# Patient Record
Sex: Female | Born: 1975 | Race: Black or African American | Hispanic: No | Marital: Married | Smoking: Never smoker
Health system: Southern US, Community
[De-identification: ages and names within clinical notes are randomized; demographics above are authoritative.]

## PROBLEM LIST (undated history)

## (undated) DIAGNOSIS — J45909 Unspecified asthma, uncomplicated: Secondary | ICD-10-CM

## (undated) DIAGNOSIS — K259 Gastric ulcer, unspecified as acute or chronic, without hemorrhage or perforation: Secondary | ICD-10-CM

## (undated) HISTORY — PX: CHOLECYSTECTOMY: SHX55

---

## 2014-09-20 ENCOUNTER — Emergency Department (HOSPITAL_COMMUNITY)
Admission: EM | Admit: 2014-09-20 | Discharge: 2014-09-21 | Disposition: A | Discharge status: Home / Self Care | Payer: Medicaid Other | Attending: Emergency Medicine | Admitting: Emergency Medicine

## 2014-09-20 ENCOUNTER — Encounter (HOSPITAL_COMMUNITY): Payer: Self-pay | Admitting: *Deleted

## 2014-09-20 ENCOUNTER — Encounter (HOSPITAL_COMMUNITY): Payer: Self-pay | Admitting: Emergency Medicine

## 2014-09-20 ENCOUNTER — Emergency Department (INDEPENDENT_AMBULATORY_CARE_PROVIDER_SITE_OTHER)
Admission: EM | Admit: 2014-09-20 | Discharge: 2014-09-20 | Disposition: A | Discharge status: Nursing Home (Includes ICF) | Payer: Medicaid Other | Source: Home / Self Care | Attending: Family Medicine | Admitting: Family Medicine

## 2014-09-20 DIAGNOSIS — R112 Nausea with vomiting, unspecified: Secondary | ICD-10-CM | POA: Diagnosis present

## 2014-09-20 DIAGNOSIS — E871 Hypo-osmolality and hyponatremia: Secondary | ICD-10-CM | POA: Diagnosis not present

## 2014-09-20 DIAGNOSIS — J45909 Unspecified asthma, uncomplicated: Secondary | ICD-10-CM | POA: Diagnosis not present

## 2014-09-20 DIAGNOSIS — K279 Peptic ulcer, site unspecified, unspecified as acute or chronic, without hemorrhage or perforation: Secondary | ICD-10-CM

## 2014-09-20 DIAGNOSIS — K219 Gastro-esophageal reflux disease without esophagitis: Secondary | ICD-10-CM | POA: Diagnosis not present

## 2014-09-20 DIAGNOSIS — N179 Acute kidney failure, unspecified: Secondary | ICD-10-CM

## 2014-09-20 DIAGNOSIS — R7989 Other specified abnormal findings of blood chemistry: Secondary | ICD-10-CM | POA: Insufficient documentation

## 2014-09-20 DIAGNOSIS — E860 Dehydration: Secondary | ICD-10-CM

## 2014-09-20 DIAGNOSIS — Z9049 Acquired absence of other specified parts of digestive tract: Secondary | ICD-10-CM | POA: Insufficient documentation

## 2014-09-20 DIAGNOSIS — R945 Abnormal results of liver function studies: Secondary | ICD-10-CM

## 2014-09-20 HISTORY — DX: Gastric ulcer, unspecified as acute or chronic, without hemorrhage or perforation: K25.9

## 2014-09-20 HISTORY — DX: Unspecified asthma, uncomplicated: J45.909

## 2014-09-20 LAB — CBC
HCT: 36.3 % (ref 36.0–46.0)
Hemoglobin: 12.6 g/dL (ref 12.0–15.0)
MCH: 27.6 pg (ref 26.0–34.0)
MCHC: 34.7 g/dL (ref 30.0–36.0)
MCV: 79.6 fL (ref 78.0–100.0)
Platelets: 229 10*3/uL (ref 150–400)
RBC: 4.56 MIL/uL (ref 3.87–5.11)
RDW: 13.3 % (ref 11.5–15.5)
WBC: 2.3 10*3/uL — ABNORMAL LOW (ref 4.0–10.5)

## 2014-09-20 LAB — URINALYSIS, ROUTINE W REFLEX MICROSCOPIC
BILIRUBIN URINE: NEGATIVE
GLUCOSE, UA: NEGATIVE mg/dL
HGB URINE DIPSTICK: NEGATIVE
KETONES UR: NEGATIVE mg/dL
Leukocytes, UA: NEGATIVE
NITRITE: NEGATIVE
Protein, ur: NEGATIVE mg/dL
SPECIFIC GRAVITY, URINE: 1.019 (ref 1.005–1.030)
UROBILINOGEN UA: 0.2 mg/dL (ref 0.0–1.0)
pH: 5.5 (ref 5.0–8.0)

## 2014-09-20 LAB — POCT I-STAT, CHEM 8
BUN: 13 mg/dL (ref 6–20)
CREATININE: 1.2 mg/dL — AB (ref 0.44–1.00)
Calcium, Ion: 1.23 mmol/L (ref 1.12–1.23)
Chloride: 94 mmol/L — ABNORMAL LOW (ref 101–111)
GLUCOSE: 72 mg/dL (ref 65–99)
HCT: 38 % (ref 36.0–46.0)
HEMOGLOBIN: 12.9 g/dL (ref 12.0–15.0)
Potassium: 4.1 mmol/L (ref 3.5–5.1)
Sodium: 129 mmol/L — ABNORMAL LOW (ref 135–145)
TCO2: 24 mmol/L (ref 0–100)

## 2014-09-20 LAB — POCT URINALYSIS DIP (DEVICE)
BILIRUBIN URINE: NEGATIVE
Glucose, UA: NEGATIVE mg/dL
Hgb urine dipstick: NEGATIVE
Ketones, ur: NEGATIVE mg/dL
NITRITE: NEGATIVE
PH: 5.5 (ref 5.0–8.0)
PROTEIN: NEGATIVE mg/dL
Specific Gravity, Urine: >=1.03 (ref 1.005–1.030)
Urobilinogen, UA: 0.2 mg/dL (ref 0.0–1.0)

## 2014-09-20 LAB — I-STAT BETA HCG BLOOD, ED (MC, WL, AP ONLY)

## 2014-09-20 LAB — POCT H PYLORI SCREEN: H. PYLORI SCREEN, POC: NEGATIVE

## 2014-09-20 LAB — COMPREHENSIVE METABOLIC PANEL
ALBUMIN: 4.8 g/dL (ref 3.5–5.0)
ALK PHOS: 85 U/L (ref 38–126)
ALT: 154 U/L — AB (ref 14–54)
ANION GAP: 10 (ref 5–15)
AST: 319 U/L — AB (ref 15–41)
BILIRUBIN TOTAL: 0.6 mg/dL (ref 0.3–1.2)
BUN: 10 mg/dL (ref 6–20)
CHLORIDE: 92 mmol/L — AB (ref 101–111)
CO2: 23 mmol/L (ref 22–32)
Calcium: 9.3 mg/dL (ref 8.9–10.3)
Creatinine, Ser: 1.08 mg/dL — ABNORMAL HIGH (ref 0.44–1.00)
GFR calc Af Amer: >60 mL/min (ref >60)
GFR calc non Af Amer: >60 mL/min (ref >60)
GLUCOSE: 66 mg/dL (ref 65–99)
POTASSIUM: 3.6 mmol/L (ref 3.5–5.1)
Sodium: 125 mmol/L — ABNORMAL LOW (ref 135–145)
Total Protein: 8.7 g/dL — ABNORMAL HIGH (ref 6.5–8.1)

## 2014-09-20 LAB — POCT PREGNANCY, URINE: Preg Test, Ur: NEGATIVE

## 2014-09-20 LAB — LIPASE, BLOOD: Lipase: 29 U/L (ref 22–51)

## 2014-09-20 MED ORDER — SODIUM CHLORIDE 0.9 % IV BOLUS (SEPSIS)
1000.0000 mL | Freq: Once | INTRAVENOUS | Status: AC
  Administered 2014-09-20: 1000 mL via INTRAVENOUS

## 2014-09-20 MED ORDER — ONDANSETRON 4 MG PO TBDP
ORAL_TABLET | ORAL | Status: AC
  Filled 2014-09-20: qty 1

## 2014-09-20 MED ORDER — GI COCKTAIL ~~LOC~~
30.0000 mL | Freq: Once | ORAL | Status: AC
  Administered 2014-09-20: 30 mL via ORAL

## 2014-09-20 MED ORDER — GI COCKTAIL ~~LOC~~
ORAL | Status: AC
  Filled 2014-09-20: qty 30

## 2014-09-20 MED ORDER — ONDANSETRON HCL 4 MG/2ML IJ SOLN
4.0000 mg | Freq: Once | INTRAMUSCULAR | Status: AC
  Administered 2014-09-20: 4 mg via INTRAVENOUS
  Filled 2014-09-20: qty 2

## 2014-09-20 MED ORDER — ONDANSETRON 4 MG PO TBDP
4.0000 mg | ORAL_TABLET | Freq: Once | ORAL | Status: AC
  Administered 2014-09-20: 4 mg via ORAL

## 2014-09-20 NOTE — Discharge Instructions (Signed)
You are very dehydrated Please go to the emergency room

## 2014-09-20 NOTE — ED Notes (Signed)
Pt being transferred to the ED via shuttle for further workup and fluids.  Report was called to the First RN, Clydie BraunKaren.

## 2014-09-20 NOTE — ED Provider Notes (Addendum)
CSN: [id number]     Arrival date & time 09/20/14  1328 History   First MD Initiated Contact with Patient 09/20/14 1402     Chief Complaint  Patient presents with  . Emesis  . Weakness  . Pain   (Consider location/radiation/quality/duration/timing/severity/associated sxs/prior Treatment) HPI  Pt encounter aided by husband who acts as interpreter. Patient with long-standing abdominal pain complaints with workup in [place] significant for gastritis/PUD. Cholecystectomy in 2012. Upper endoscopy in 2013. Patient states that she was on a medicine to help with this and has not taken it since [place] from [place] 2 [place] ago. Patient states that her abdominal pain started 2 days ago and she has not had anything to eat since that point time. Minimal quit intake during this period time. Patient reports normal daily bowel movements. Emesis 1 this morning which is nonbloody nonbilious. Patient states that she has episodes like this from time to time. Denies fevers, chest pain, short of breath, palpitations, neck stiffness, headache, rash, vaginal discharge, dysuria, frequency, back pain.   Past Medical History  Diagnosis Date  . Asthma   . Multiple gastric ulcers    Past Surgical History  Procedure Laterality Date  . Cholecystectomy     History reviewed. No pertinent family history. History  Substance Use Topics  . Smoking status: Never Smoker   . Smokeless tobacco: Never Used  . Alcohol Use: No   OB History    No data available     Review of Systems Per HPI with all other pertinent systems negative.   Allergies  Other  Home Medications   Prior to Admission medications   Not on File   BP 112/76 mmHg  Pulse 79  Temp(Src) 98.3 F (36.8 C) (Oral)  Resp 16  SpO2 100%  LMP 07/21/2014 (Approximate) Physical Exam  ED Course  Procedures (including critical care time) Labs Review Labs Reviewed  POCT URINALYSIS DIP (DEVICE) - Abnormal; Notable for the following:    Leukocytes, UA TRACE (*)    All other components within normal limits  POCT I-STAT, CHEM 8 - Abnormal; Notable for the following:    Sodium 129 (*)    Chloride 94 (*)    Creatinine, Ser 1.20 (*)    All other components within normal limits  POCT PREGNANCY, URINE  POCT H PYLORI SCREEN    Imaging Review No results found.   MDM   1. Dehydration   2. PUD (peptic ulcer disease)   3. AKI (acute kidney injury)   4. Hyponatremia    Pt pathetic looking and weak. Suspect her lab abnormalities are from dehydration. Pt arrived from [place] 2 wks ago and does not have a PCP. Pt had to have IVF for hyponatremia in [place] per husband. H.pylori neg.  Pt given gatorade (unable to obtain IV access), Zofran, and a GI cocktail.  WIll transfer pt to ED for further evaluation and treatment as appropriate.     Ozella Rocksavid J Merrell, MD 09/20/14 1558  Ozella Rocksavid J Merrell, MD 09/20/14 [phone]

## 2014-09-20 NOTE — ED Notes (Signed)
Pt is recently here from [place].  She has a report from her doctor in [place] about her issues.  Pt looks drawn, tired, and mournful.  Pt's husband states she has recently been vomiting.  She has pain in her mid chest, her head, her mid back and her lower extremities.  She has not been eating and she is unable to perform her ADL's or take care of her children.

## 2014-09-20 NOTE — ED Notes (Signed)
Family at bedside. 

## 2014-09-20 NOTE — ED Notes (Signed)
No answer x1

## 2014-09-20 NOTE — ED Notes (Signed)
Pt is a difficult stick, unable to obtain blood at triage.

## 2014-09-20 NOTE — ED Notes (Signed)
Pt sent here from [facility]. Reports having headache and vomiting this am. Reports pain to chest and back that occurs when vomiting. No acute distress noted at triage.

## 2014-09-21 ENCOUNTER — Emergency Department (HOSPITAL_COMMUNITY): Payer: Medicaid Other

## 2014-09-21 LAB — I-STAT TROPONIN, ED: Troponin i, poc: 0 ng/mL (ref 0.00–0.08)

## 2014-09-21 MED ORDER — SUCRALFATE 1 G PO TABS: 1.0000 g | ORAL_TABLET | Freq: Three times a day (TID) | ORAL | Status: DC

## 2014-09-21 MED ORDER — FAMOTIDINE 20 MG PO TABS: 20.0000 mg | ORAL_TABLET | Freq: Two times a day (BID) | ORAL | Status: DC

## 2014-09-21 MED ORDER — PROMETHAZINE HCL 25 MG PO TABS: 25.0000 mg | ORAL_TABLET | Freq: Three times a day (TID) | ORAL | Status: DC | PRN

## 2014-09-21 NOTE — Discharge Instructions (Signed)
Follow-up with the GI Dr. provided.  Return here as needed °

## 2014-09-21 NOTE — ED Provider Notes (Signed)
CSN: [id number]     Arrival date & time 09/20/14  1621 History   First MD Initiated Contact with Patient 09/20/14 1813     Chief Complaint  Patient presents with  . Emesis  . Headache  . Back Pain     (Consider location/radiation/quality/duration/timing/severity/associated sxs/prior Treatment) HPI Patient presents to the emergency department with upper abdominal pain with nausea and vomiting times one today.  Patient states that she has had episodes similar to this in the past.  She is also had a history of GERD and peptic ulcer disease.  Patient has no chest pain, shortness of breath, diarrhea, weakness, dizziness, headache, blurred vision, dysuria, incontinence, back pain, bloody stool, hematemesis, or syncope.  Patient states nothing seems make her condition better or worse.  Patient is not having any current pain at this time.  The husband is the translator for me.  He does seem to understand very well my questions Past Medical History  Diagnosis Date  . Asthma   . Multiple gastric ulcers    Past Surgical History  Procedure Laterality Date  . Cholecystectomy     Family History  Problem Relation Age of Onset  . Cancer Neg Hx   . Diabetes Neg Hx   . Heart failure Neg Hx   . Hyperlipidemia Neg Hx    History  Substance Use Topics  . Smoking status: Never Smoker   . Smokeless tobacco: Never Used  . Alcohol Use: No   OB History    No data available     Review of Systems   All other systems negative except as documented in the HPI. All pertinent positives and negatives as reviewed in the HPI. Allergies  Other  Home Medications   Prior to Admission medications   Not on File   BP 93/74 mmHg  Pulse 50  Temp(Src) 98.3 F (36.8 C) (Oral)  Resp 5  SpO2 99%  LMP 07/21/2014 (Approximate) Physical Exam  Constitutional: She is oriented to person, place, and time. She appears well-developed and well-nourished.  HENT:  Head: Normocephalic and atraumatic.  Mouth/Throat:  Oropharynx is clear and moist.  Eyes: Pupils are equal, round, and reactive to light.  Neck: Normal range of motion. Neck supple.  Cardiovascular: Normal rate and normal heart sounds.  Exam reveals no gallop and no friction rub.   No murmur heard. Pulmonary/Chest: Effort normal and breath sounds normal. No respiratory distress.  Abdominal: Soft. Bowel sounds are normal. She exhibits no distension. There is no tenderness. There is no rebound and no guarding.  Musculoskeletal: She exhibits no edema.  Neurological: She is alert and oriented to person, place, and time. She exhibits normal muscle tone. Coordination normal.  Skin: Skin is warm and dry. No rash noted. No erythema.  Psychiatric: She has a normal mood and affect. Her behavior is normal.  Nursing note and vitals reviewed.   ED Course  Procedures (including critical care time) Labs Review Labs Reviewed  COMPREHENSIVE METABOLIC PANEL - Abnormal; Notable for the following:    Sodium 125 (*)    Chloride 92 (*)    Creatinine, Ser 1.08 (*)    Total Protein 8.7 (*)    AST 319 (*)    ALT 154 (*)    All other components within normal limits  CBC - Abnormal; Notable for the following:    WBC 2.3 (*)    All other components within normal limits  LIPASE, BLOOD  URINALYSIS, ROUTINE W REFLEX MICROSCOPIC (NOT AT [facility])  RAPID  HIV SCREEN (HIV 1/2 AB+AG)  I-STAT BETA HCG BLOOD, ED ([facility], [facility], [facility] ONLY)  I-STAT TROPOININ, ED    Imaging Review No results found.   EKG Interpretation None      MDM   Final diagnoses:  Elevated LFTs    The patient is feeling better at this time following IV fluids.  There was difficulty getting her IV and that is caused her to have an extended emergency department visit.  Patient is going to need follow-up with GI and further evaluation  Charlestine NightChristopher Trajon Rosete, PA-C 09/21/14 0103  Gilda Creasehristopher J Pollina, MD 09/21/14 0111

## 2014-09-22 ENCOUNTER — Encounter (HOSPITAL_COMMUNITY): Payer: Self-pay | Admitting: Emergency Medicine

## 2014-09-22 ENCOUNTER — Emergency Department (HOSPITAL_COMMUNITY)
Admission: EM | Admit: 2014-09-22 | Discharge: 2014-09-22 | Disposition: A | Discharge status: Home / Self Care | Payer: Medicaid Other | Attending: Emergency Medicine | Admitting: Emergency Medicine

## 2014-09-22 DIAGNOSIS — R112 Nausea with vomiting, unspecified: Secondary | ICD-10-CM | POA: Diagnosis present

## 2014-09-22 DIAGNOSIS — R531 Weakness: Secondary | ICD-10-CM | POA: Diagnosis not present

## 2014-09-22 DIAGNOSIS — R1013 Epigastric pain: Secondary | ICD-10-CM | POA: Diagnosis not present

## 2014-09-22 DIAGNOSIS — J45909 Unspecified asthma, uncomplicated: Secondary | ICD-10-CM | POA: Insufficient documentation

## 2014-09-22 DIAGNOSIS — Z79899 Other long term (current) drug therapy: Secondary | ICD-10-CM | POA: Insufficient documentation

## 2014-09-22 LAB — COMPREHENSIVE METABOLIC PANEL
ALBUMIN: 4.4 g/dL (ref 3.5–5.0)
ALT: 135 U/L — AB (ref 14–54)
AST: 238 U/L — ABNORMAL HIGH (ref 15–41)
Alkaline Phosphatase: 86 U/L (ref 38–126)
Anion gap: 9 (ref 5–15)
BUN: 9 mg/dL (ref 6–20)
CHLORIDE: 90 mmol/L — AB (ref 101–111)
CO2: 22 mmol/L (ref 22–32)
CREATININE: 0.93 mg/dL (ref 0.44–1.00)
Calcium: 8.5 mg/dL — ABNORMAL LOW (ref 8.9–10.3)
Glucose, Bld: 58 mg/dL — ABNORMAL LOW (ref 65–99)
Potassium: 3.5 mmol/L (ref 3.5–5.1)
SODIUM: 121 mmol/L — AB (ref 135–145)
TOTAL PROTEIN: 7.8 g/dL (ref 6.5–8.1)
Total Bilirubin: 0.7 mg/dL (ref 0.3–1.2)

## 2014-09-22 LAB — CBC WITH DIFFERENTIAL/PLATELET
BASOS PCT: 3 % — AB (ref 0–1)
Basophils Absolute: 0.1 10*3/uL (ref 0.0–0.1)
Eosinophils Absolute: 0 10*3/uL (ref 0.0–0.7)
Eosinophils Relative: 2 % (ref 0–5)
HCT: 32.9 % — ABNORMAL LOW (ref 36.0–46.0)
Hemoglobin: 11.9 g/dL — ABNORMAL LOW (ref 12.0–15.0)
LYMPHS ABS: 0.9 10*3/uL (ref 0.7–4.0)
Lymphocytes Relative: 40 % (ref 12–46)
MCH: 28.1 pg (ref 26.0–34.0)
MCHC: 36.2 g/dL — ABNORMAL HIGH (ref 30.0–36.0)
MCV: 77.8 fL — AB (ref 78.0–100.0)
MONOS PCT: 11 % (ref 3–12)
Monocytes Absolute: 0.3 10*3/uL (ref 0.1–1.0)
NEUTROS ABS: 1 10*3/uL — AB (ref 1.7–7.7)
Neutrophils Relative %: 44 % (ref 43–77)
PLATELETS: 230 10*3/uL (ref 150–400)
RBC: 4.23 MIL/uL (ref 3.87–5.11)
RDW: 13 % (ref 11.5–15.5)
WBC: 2.3 10*3/uL — ABNORMAL LOW (ref 4.0–10.5)

## 2014-09-22 MED ORDER — ONDANSETRON HCL 4 MG PO TABS: 4.0000 mg | ORAL_TABLET | Freq: Four times a day (QID) | ORAL | Status: DC

## 2014-09-22 MED ORDER — GI COCKTAIL ~~LOC~~
30.0000 mL | Freq: Once | ORAL | Status: AC
  Administered 2014-09-22: 30 mL via ORAL
  Filled 2014-09-22: qty 30

## 2014-09-22 MED ORDER — SODIUM CHLORIDE 0.9 % IV BOLUS (SEPSIS)
1000.0000 mL | Freq: Once | INTRAVENOUS | Status: AC
  Administered 2014-09-22: 1000 mL via INTRAVENOUS

## 2014-09-22 NOTE — ED Notes (Signed)
Bed: WA25 Expected date:  Expected time:  Means of arrival:  Comments: Pt still in room  

## 2014-09-22 NOTE — ED Notes (Signed)
DC instructions completed. Pt would not acknowledge questions. DC instruction reviewed with pt and spouse. Spouse reports that pt is unable to walk, he made reference to prior admission to [facility] earlier this week and taxi voucher given. Will f/u with SW

## 2014-09-22 NOTE — ED Notes (Signed)
Nurse currently starting IV 

## 2014-09-22 NOTE — Progress Notes (Signed)
EDCM spoke to patient's husband at bedside.  Patient is asleep.  Patient's husband remarks they have only been in the [place] for 2 [place].  Patient listed as having Medicaid [place].  EDCM provided patient's husband with list of pcps who [place].  Explained to patient's husband he may pick one pcp and call the phone number listed to establish primary care for his wife.  Patient's husband verbalized understanding.  No further EDCM needs at this time.

## 2014-09-22 NOTE — ED Notes (Signed)
Questions r/t dc were denied. Pt will use wheelchair for transport home. Spouse at bedside

## 2014-09-22 NOTE — Progress Notes (Signed)
CSW was notified by Nurse that the pt is having issues with transportation.   CSW provided pt with [employer] vouchers. Husband rode with pt also.  Trish MageBrittney Detric Scalisi, LCSWA [phone] ED CSW 09/22/2014 8:32 PM

## 2014-09-22 NOTE — Discharge Instructions (Signed)
Nausea and Vomiting Follow up with your primary care physician. Take Zofran as needed for nausea. Nausea is a sick feeling that often comes before throwing up (vomiting). Vomiting is a reflex where stomach contents come out of your mouth. Vomiting can cause severe loss of body fluids (dehydration). Children and elderly adults can become dehydrated quickly, especially if they also have diarrhea. Nausea and vomiting are symptoms of a condition or disease. It is important to find the cause of your symptoms. CAUSES   Direct irritation of the stomach lining. This irritation can result from increased acid production (gastroesophageal reflux disease), infection, food poisoning, taking certain medicines (such as nonsteroidal anti-inflammatory drugs), alcohol use, or tobacco use.  Signals from the brain.These signals could be caused by a headache, heat exposure, an inner ear disturbance, increased pressure in the brain from injury, infection, a tumor, or a concussion, pain, emotional stimulus, or metabolic problems.  An obstruction in the gastrointestinal tract (bowel obstruction).  Illnesses such as diabetes, hepatitis, gallbladder problems, appendicitis, kidney problems, cancer, sepsis, atypical symptoms of a heart attack, or eating disorders.  Medical treatments such as chemotherapy and radiation.  Receiving medicine that makes you sleep (general anesthetic) during surgery. DIAGNOSIS Your caregiver may ask for tests to be done if the problems do not improve after a few days. Tests may also be done if symptoms are severe or if the reason for the nausea and vomiting is not clear. Tests may include:  Urine tests.  Blood tests.  Stool tests.  Cultures (to look for evidence of infection).  X-rays or other imaging studies. Test results can help your caregiver make decisions about treatment or the need for additional tests. TREATMENT You need to stay well hydrated. Drink frequently but in small  amounts.You may wish to drink water, sports drinks, clear broth, or eat frozen ice pops or gelatin dessert to help stay hydrated.When you eat, eating slowly may help prevent nausea.There are also some antinausea medicines that may help prevent nausea. HOME CARE INSTRUCTIONS   Take all medicine as directed by your caregiver.  If you do not have an appetite, do not force yourself to eat. However, you must continue to drink fluids.  If you have an appetite, eat a normal diet unless your caregiver tells you differently.  Eat a variety of complex carbohydrates (rice, wheat, potatoes, bread), lean meats, yogurt, fruits, and vegetables.  Avoid high-fat foods because they are more difficult to digest.  Drink enough water and fluids to keep your urine clear or pale yellow.  If you are dehydrated, ask your caregiver for specific rehydration instructions. Signs of dehydration may include:  Severe thirst.  Dry lips and mouth.  Dizziness.  Dark urine.  Decreasing urine frequency and amount.  Confusion.  Rapid breathing or pulse. SEEK IMMEDIATE MEDICAL CARE IF:   You have blood or brown flecks (like coffee grounds) in your vomit.  You have black or bloody stools.  You have a severe headache or stiff neck.  You are confused.  You have severe abdominal pain.  You have chest pain or trouble breathing.  You do not urinate at least once every 8 hours.  You develop cold or clammy skin.  You continue to vomit for longer than 24 to 48 hours.  You have a fever. MAKE SURE YOU:   Understand these instructions.  Will watch your condition.  Will get help right away if you are not doing well or get worse. Document Released: 02/24/2005 Document Revised: 05/19/2011 Document  Reviewed: 07/24/2010 ExitCare Patient Information 2015 [place], [place]. This information is not intended to replace advice given to you by your health care provider. Make sure you discuss any questions you have  with your health care provider.

## 2014-09-22 NOTE — ED Notes (Signed)
Per family member, was seen at [facility] yesterday for same symptoms-unable to keep food, medicine down

## 2014-09-22 NOTE — ED Provider Notes (Signed)
CSN: [id number]     Arrival date & time 09/22/14  1427 History   First MD Initiated Contact with Patient 09/22/14 1620     Chief Complaint  Patient presents with  . Emesis     (Consider location/radiation/quality/duration/timing/severity/associated sxs/prior Treatment) Patient is a 39 y.o. female presenting with vomiting. The history is provided by the patient. No language interpreter was used.  Emesis Associated symptoms: no chills and no diarrhea   Jody Powell is a 38 y.o female with a history of asthma who presents with abdominal pain with nausea and vomiting for the past 3 days. Jody Powell states Jody Powell's had similar episodes to this in the past with a history of PUD and GERD. Jody Powell LMP was at the end of May. Jody Powell states Jody Powell was given Carafate, Pepcid, Phenergan by Jody Powell PCP on 09/20/2014. Jody Powell states Jody Powell vomited the Carafate but felt better with Pepcid and Phenergan and has not vomited today. Jody Powell denies any fever, chills, dizziness, chest pain, cough, shortness of breath, diarrhea, constipation, hematemesis, hematochezia. Jody Powell husband is the Nurse, learning disability.  Past Medical History  Diagnosis Date  . Asthma   . Multiple gastric ulcers    Past Surgical History  Procedure Laterality Date  . Cholecystectomy     Family History  Problem Relation Age of Onset  . Cancer Neg Hx   . Diabetes Neg Hx   . Heart failure Neg Hx   . Hyperlipidemia Neg Hx    History  Substance Use Topics  . Smoking status: Never Smoker   . Smokeless tobacco: Never Used  . Alcohol Use: No   OB History    No data available     Review of Systems  Constitutional: Negative for fever and chills.  Respiratory: Negative for shortness of breath.   Cardiovascular: Negative for chest pain.  Gastrointestinal: Positive for vomiting. Negative for diarrhea and constipation.  Genitourinary: Negative for hematuria, vaginal bleeding and vaginal discharge.  Neurological: Positive for weakness. Negative for dizziness, syncope and  light-headedness.  All other systems reviewed and are negative.     Allergies  Other  Home Medications   Prior to Admission medications   Medication Sig Start Date End Date Taking? Authorizing Provider  famotidine (PEPCID) 20 MG tablet Take 1 tablet (20 mg total) by mouth 2 (two) times daily. 09/21/14  Yes Christopher Lawyer, PA-C  promethazine (PHENERGAN) 25 MG tablet Take 1 tablet (25 mg total) by mouth every 8 (eight) hours as needed for nausea or vomiting. 09/21/14  Yes Charlestine Night, PA-C  sucralfate (CARAFATE) 1 G tablet Take 1 tablet (1 g total) by mouth 4 (four) times daily -  with meals and at bedtime. 09/21/14  Yes Christopher Lawyer, PA-C  ondansetron (ZOFRAN) 4 MG tablet Take 1 tablet (4 mg total) by mouth every 6 (six) hours. 09/22/14   Endrit Gittins Patel-Mills, PA-C   BP 108/75 mmHg  Pulse 65  Temp(Src) 98.3 F (36.8 C) (Oral)  Resp 16  SpO2 98%  LMP 07/21/2014 (Approximate) Physical Exam  Constitutional: Jody Powell is oriented to person, place, and time. Jody Powell appears well-developed and well-nourished. No distress.  HENT:  Head: Normocephalic and atraumatic.  Eyes: Conjunctivae are normal.  Neck: Normal range of motion. Neck supple.  Cardiovascular: Normal rate, regular rhythm and normal heart sounds.   Pulmonary/Chest: Effort normal and breath sounds normal. No respiratory distress. Jody Powell has no wheezes. Jody Powell has no rales.  Abdominal: Soft. Jody Powell exhibits no distension and no mass. There is tenderness in the epigastric area. There is no  rigidity, no rebound, no guarding and no CVA tenderness.  Musculoskeletal: Normal range of motion. Jody Powell exhibits no edema.  Neurological: Jody Powell is alert and oriented to person, place, and time.  Skin: Skin is warm and dry.  Psychiatric: Jody Powell has a normal mood and affect. Jody Powell behavior is normal.  Nursing note and vitals reviewed.   ED Course  Procedures (including critical care time) Labs Review Labs Reviewed  CBC WITH DIFFERENTIAL/PLATELET -  Abnormal; Notable for the following:    WBC 2.3 (*)    Hemoglobin 11.9 (*)    HCT 32.9 (*)    MCV 77.8 (*)    MCHC 36.2 (*)    Basophils Relative 3 (*)    Neutro Abs 1.0 (*)    All other components within normal limits  COMPREHENSIVE METABOLIC PANEL - Abnormal; Notable for the following:    Sodium 121 (*)    Chloride 90 (*)    Glucose, Bld 58 (*)    Calcium 8.5 (*)    AST 238 (*)    ALT 135 (*)    All other components within normal limits    Imaging Review No results found.   EKG Interpretation None      MDM   Final diagnoses:  Non-intractable vomiting with nausea, vomiting of unspecified type   Jody Powell was hypotensive on initial evaluation. Patient was seen on 09/20/2014 by Jody Powell primary care physician as well as in the ED for the same. At that time Jody Powell beta hCG was negative. Jody Powell labs were not concerning. Jody Powell had a cardiac workup which was negative also.  Due to Jody Powell previous workups I obtained a CMP and CBC only which were not concerning. Medications  sodium chloride 0.9 % bolus 1,000 mL (0 mLs Intravenous Stopped 09/22/14 1918)  gi cocktail (Maalox,Lidocaine,Donnatal) (30 mLs Oral Given 09/22/14 1704)  Recheck: Jody Powell is tolerating by mouth fluids and eating crackers at bedside. Jody Powell states Jody Powell no longer has abdominal pain after fluids and GI cocktail.  I gave Jody Powell zofran and discussed following up with Jody Powell primary care physician.     Catha GosselinHanna Patel-Mills, PA-C 09/23/14 1233  Doug SouSam Jacubowitz, MD 09/23/14 [phone]

## 2014-09-22 NOTE — ED Notes (Signed)
Pt alert and oriented x4. Respirations even and unlabored, bilateral symmetrical rise and fall of chest. Skin warm and dry. In no acute distress. Denies needs.   

## 2014-10-06 ENCOUNTER — Encounter (HOSPITAL_COMMUNITY): Payer: Self-pay | Admitting: Emergency Medicine

## 2014-10-06 ENCOUNTER — Emergency Department (HOSPITAL_COMMUNITY): Payer: Medicaid Other

## 2014-10-06 ENCOUNTER — Emergency Department (HOSPITAL_COMMUNITY)
Admission: EM | Admit: 2014-10-06 | Discharge: 2014-10-07 | Disposition: A | Discharge status: Home / Self Care | Payer: Medicaid Other | Attending: Emergency Medicine | Admitting: Emergency Medicine

## 2014-10-06 DIAGNOSIS — Z8719 Personal history of other diseases of the digestive system: Secondary | ICD-10-CM | POA: Insufficient documentation

## 2014-10-06 DIAGNOSIS — R079 Chest pain, unspecified: Secondary | ICD-10-CM | POA: Diagnosis present

## 2014-10-06 DIAGNOSIS — Z79899 Other long term (current) drug therapy: Secondary | ICD-10-CM | POA: Insufficient documentation

## 2014-10-06 DIAGNOSIS — J45909 Unspecified asthma, uncomplicated: Secondary | ICD-10-CM | POA: Diagnosis not present

## 2014-10-06 DIAGNOSIS — R1010 Upper abdominal pain, unspecified: Secondary | ICD-10-CM | POA: Diagnosis not present

## 2014-10-06 DIAGNOSIS — Z3202 Encounter for pregnancy test, result negative: Secondary | ICD-10-CM | POA: Diagnosis not present

## 2014-10-06 LAB — CBC
HCT: 32.8 % — ABNORMAL LOW (ref 36.0–46.0)
Hemoglobin: 11.4 g/dL — ABNORMAL LOW (ref 12.0–15.0)
MCH: 27.3 pg (ref 26.0–34.0)
MCHC: 34.8 g/dL (ref 30.0–36.0)
MCV: 78.5 fL (ref 78.0–100.0)
PLATELETS: 228 10*3/uL (ref 150–400)
RBC: 4.18 MIL/uL (ref 3.87–5.11)
RDW: 13.1 % (ref 11.5–15.5)
WBC: 3.1 10*3/uL — AB (ref 4.0–10.5)

## 2014-10-06 LAB — BASIC METABOLIC PANEL
ANION GAP: 9 (ref 5–15)
BUN: 11 mg/dL (ref 6–20)
CHLORIDE: 94 mmol/L — AB (ref 101–111)
CO2: 22 mmol/L (ref 22–32)
Calcium: 8.8 mg/dL — ABNORMAL LOW (ref 8.9–10.3)
Creatinine, Ser: 1.07 mg/dL — ABNORMAL HIGH (ref 0.44–1.00)
GFR calc Af Amer: >60 mL/min (ref >60)
GFR calc non Af Amer: >60 mL/min (ref >60)
Glucose, Bld: 72 mg/dL (ref 65–99)
Potassium: 3.6 mmol/L (ref 3.5–5.1)
Sodium: 125 mmol/L — ABNORMAL LOW (ref 135–145)

## 2014-10-06 LAB — POC URINE PREG, ED: Preg Test, Ur: NEGATIVE

## 2014-10-06 LAB — I-STAT TROPONIN, ED: Troponin i, poc: 0 ng/mL (ref 0.00–0.08)

## 2014-10-06 MED ORDER — ONDANSETRON HCL 4 MG/2ML IJ SOLN
4.0000 mg | Freq: Once | INTRAMUSCULAR | Status: AC
  Administered 2014-10-06: 4 mg via INTRAVENOUS
  Filled 2014-10-06: qty 2

## 2014-10-06 MED ORDER — SODIUM CHLORIDE 0.9 % IV BOLUS (SEPSIS)
1000.0000 mL | Freq: Once | INTRAVENOUS | Status: AC
Start: 2014-10-06 — End: 2014-10-07
  Administered 2014-10-06: 1000 mL via INTRAVENOUS

## 2014-10-06 MED ORDER — IOHEXOL 300 MG/ML  SOLN: 100.0000 mL | Freq: Once | INTRAMUSCULAR | Status: AC | PRN

## 2014-10-06 MED ORDER — IOHEXOL 300 MG/ML  SOLN
25.0000 mL | Freq: Once | INTRAMUSCULAR | Status: AC | PRN
  Administered 2014-10-06: 25 mL via ORAL

## 2014-10-06 NOTE — ED Notes (Signed)
PA at bedside.

## 2014-10-06 NOTE — ED Notes (Signed)
Patient transported to CT 

## 2014-10-06 NOTE — ED Notes (Signed)
Pt speaks [place]ili; Husband at bedside and speaks some [place]

## 2014-10-06 NOTE — ED Notes (Signed)
RN attempted to start IV; 2nd RN to start IV  

## 2014-10-06 NOTE — ED Notes (Signed)
Pt. reports intermittent central chest pain /right lateral chest pain onset this week with generalized body aches and emesis . Denies SOB , no cough or congestion .

## 2014-10-06 NOTE — ED Provider Notes (Signed)
CSN: [id number]     Arrival date & time 10/06/14  1911 History   First MD Initiated Contact with Patient 10/06/14 2030     Chief Complaint  Patient presents with  . Chest Pain     (Consider location/radiation/quality/duration/timing/severity/associated sxs/prior Treatment) HPI Comments: Patient is a 39 year old female with a past medical history of gastric ulcers and asthma who presents with epigastric abdominal pain and right flank pain that started about 2 weeks ago. Symptoms started gradually and have been intermittent since the onset. Patient is unable to characterize the pain. The pain is severe and does not radiate. She reports associated nausea. No aggravating/alleviating factors. No other associated symptoms. Patient has tried pepcid, phenergan, and zofran for symptoms without relief. Patient's husband is present who provides the history. He reports they arrived from [place] 1 month ago.    Past Medical History  Diagnosis Date  . Asthma   . Multiple gastric ulcers    Past Surgical History  Procedure Laterality Date  . Cholecystectomy     Family History  Problem Relation Age of Onset  . Cancer Neg Hx   . Diabetes Neg Hx   . Heart failure Neg Hx   . Hyperlipidemia Neg Hx    History  Substance Use Topics  . Smoking status: Never Smoker   . Smokeless tobacco: Never Used  . Alcohol Use: No   OB History    No data available     Review of Systems  Cardiovascular: Positive for chest pain.  Gastrointestinal: Positive for abdominal pain.  All other systems reviewed and are negative.     Allergies  Other  Home Medications   Prior to Admission medications   Medication Sig Start Date End Date Taking? Authorizing Provider  famotidine (PEPCID) 20 MG tablet Take 1 tablet (20 mg total) by mouth 2 (two) times daily. 09/21/14   Christopher Lawyer, PA-C  ondansetron (ZOFRAN) 4 MG tablet Take 1 tablet (4 mg total) by mouth every 6 (six) hours. 09/22/14   Hanna Patel-Mills,  PA-C  promethazine (PHENERGAN) 25 MG tablet Take 1 tablet (25 mg total) by mouth every 8 (eight) hours as needed for nausea or vomiting. 09/21/14   Charlestine Night, PA-C   BP 105/81 mmHg  Pulse 84  Temp(Src) 97.8 F (36.6 C) (Oral)  Resp 20  SpO2 99%  LMP 07/21/2014 (Approximate) Physical Exam  Constitutional: She is oriented to person, place, and time. She appears well-developed and well-nourished. No distress.  HENT:  Head: Normocephalic and atraumatic.  Eyes: Conjunctivae and EOM are normal.  Neck: Normal range of motion.  Cardiovascular: Normal rate and regular rhythm.  Exam reveals no gallop and no friction rub.   No murmur heard. Pulses equal of bilateral lower extremities.   Pulmonary/Chest: Effort normal and breath sounds normal. She has no wheezes. She has no rales. She exhibits no tenderness.  Abdominal: Soft. She exhibits no distension. There is tenderness. There is no rebound.  Central abdominal tenderness to palpation.   Musculoskeletal: Normal range of motion.  Neurological: She is alert and oriented to person, place, and time. Coordination normal.  Speech is goal-oriented. Moves limbs without ataxia.   Skin: Skin is warm and dry.  Psychiatric: She has a normal mood and affect. Her behavior is normal.  Nursing note and vitals reviewed.   ED Course  Procedures (including critical care time) Labs Review Labs Reviewed  BASIC METABOLIC PANEL - Abnormal; Notable for the following:    Sodium 125 (*)  Chloride 94 (*)    Creatinine, Ser 1.07 (*)    Calcium 8.8 (*)    All other components within normal limits  CBC - Abnormal; Notable for the following:    WBC 3.1 (*)    Hemoglobin 11.4 (*)    HCT 32.8 (*)    All other components within normal limits  HEPATIC FUNCTION PANEL  LIPASE, BLOOD  I-STAT TROPOININ, ED  POC URINE PREG, ED  I-STAT TROPOININ, ED    Imaging Review Dg Chest 2 View  10/06/2014   CLINICAL DATA:  Intermittent central and right lateral  chest pain, onset earlier this week. No dyspnea.  EXAM: CHEST  2 VIEW  COMPARISON:  None.  FINDINGS: The heart size and mediastinal contours are within normal limits. Both lungs are clear. The visualized skeletal structures are unremarkable.  IMPRESSION: No active cardiopulmonary disease.   Electronically Signed   By: Ellery Plunk M.D.   On: 10/06/2014 20:10   Ct Abdomen Pelvis W Contrast  10/07/2014   CLINICAL DATA:  Abdominal pain.  History of gastric ulcers.  EXAM: CT ABDOMEN AND PELVIS WITH CONTRAST  TECHNIQUE: Multidetector CT imaging of the abdomen and pelvis was performed using the standard protocol following bolus administration of intravenous contrast.  CONTRAST:  75mL OMNIPAQUE IOHEXOL 300 MG/ML  SOLN  COMPARISON:  Ultrasound 09/21/2014  FINDINGS: Lower chest: There are mild ground-glass opacities in the posterior lower lobes bilaterally, infectious versus atelectatic. There is a trace right pleural effusion.  Hepatobiliary: There is prior cholecystectomy. The liver and bile ducts appear unremarkable.  Pancreas: Normal  Spleen: Normal  Adrenals/Urinary Tract: The adrenals and kidneys are normal in appearance. There is no urinary calculus evident. There is no hydronephrosis or ureteral dilatation. Collecting systems and ureters appear unremarkable.  Stomach/Bowel: There is mild mural thickening of the entire colon, raising the question of colitis. No focal abnormality is evident. There is no bowel obstruction. There is no extraluminal air. Small bowel is unremarkable. Stomach is unremarkable.  Vascular/Lymphatic: The abdominal aorta is normal in caliber. There is no atherosclerotic calcification. There is no adenopathy in the abdomen or pelvis.  Reproductive: The uterus and ovaries appear unremarkable.  Other: No focal inflammatory changes are evident in the abdomen or pelvis. There is no ascites.  Musculoskeletal: No significant abnormality.  IMPRESSION: 1. Mild ground-glass opacities in the  posterior lung bases. Trace right pleural effusion. Cannot exclude infectious infiltrates. 2. Mild diffuse mural thickening of the entire colon. Query colitis. 3. Negative for bowel obstruction, perforation, focal inflammatory change, ascites or adenopathy.   Electronically Signed   By: Ellery Plunk M.D.   On: 10/07/2014 00:52     EKG Interpretation   Date/Time:  Friday October 06 2014 19:16:34 EDT Ventricular Rate:  75 PR Interval:  170 QRS Duration: 82 QT Interval:  380 QTC Calculation: 424 R Axis:   78 Text Interpretation:  Normal sinus rhythm Low voltage QRS Nonspecific T  wave abnormality Abnormal ECG No old tracing to compare Confirmed by  [facility]  MD, TREY ([id number]) on 10/06/2014 9:09:30 PM      MDM   Final diagnoses:  Pain of upper abdomen    9:13 PM Labs unremarkable for acute changes. Chest xray unremarkable for acute changes.   CT shows no acute changes. Patient reports some improvement since being in the ED. Patient able to tolerate crackers PO. Patient's husband tells me they have left some of their children in [place] to come to the [place]. He believes there  is an element of stress to her symptoms. I will message our case management nurse to see if her PCP appointment can be moved up. Patient will be discharged with pain medication and instructions to return with worsening or concerning symptoms.   [address] [place], PA-C 10/07/14 1610  Blake Divine, MD 10/08/14 [phone]

## 2014-10-07 ENCOUNTER — Encounter (HOSPITAL_COMMUNITY): Payer: Self-pay | Admitting: Radiology

## 2014-10-07 LAB — I-STAT TROPONIN, ED: Troponin i, poc: 0.04 ng/mL (ref 0.00–0.08)

## 2014-10-07 MED ORDER — IOHEXOL 300 MG/ML  SOLN
75.0000 mL | Freq: Once | INTRAMUSCULAR | Status: AC | PRN
  Administered 2014-10-07: 75 mL via INTRAVENOUS

## 2014-10-07 MED ORDER — TRAMADOL HCL 50 MG PO TABS: 50.0000 mg | ORAL_TABLET | Freq: Four times a day (QID) | ORAL | Status: DC | PRN

## 2014-10-07 NOTE — Discharge Instructions (Signed)
Take Tramadol as needed for pain. Refer to attached documents for more information.  °

## 2014-11-10 ENCOUNTER — Encounter (HOSPITAL_COMMUNITY): Payer: Self-pay | Admitting: Emergency Medicine

## 2014-11-10 ENCOUNTER — Inpatient Hospital Stay (HOSPITAL_COMMUNITY)
Admission: EM | Admit: 2014-11-10 | Discharge: 2014-11-15 | DRG: 871 | Disposition: A | Discharge status: Home / Self Care | Payer: Medicaid Other | Attending: Internal Medicine | Admitting: Internal Medicine

## 2014-11-10 ENCOUNTER — Emergency Department (HOSPITAL_COMMUNITY): Payer: Medicaid Other

## 2014-11-10 DIAGNOSIS — T3695XA Adverse effect of unspecified systemic antibiotic, initial encounter: Secondary | ICD-10-CM | POA: Diagnosis present

## 2014-11-10 DIAGNOSIS — Z8711 Personal history of peptic ulcer disease: Secondary | ICD-10-CM

## 2014-11-10 DIAGNOSIS — E43 Unspecified severe protein-calorie malnutrition: Secondary | ICD-10-CM | POA: Diagnosis present

## 2014-11-10 DIAGNOSIS — Z79899 Other long term (current) drug therapy: Secondary | ICD-10-CM | POA: Diagnosis not present

## 2014-11-10 DIAGNOSIS — H578 Other specified disorders of eye and adnexa: Secondary | ICD-10-CM | POA: Diagnosis present

## 2014-11-10 DIAGNOSIS — E860 Dehydration: Secondary | ICD-10-CM | POA: Diagnosis present

## 2014-11-10 DIAGNOSIS — E871 Hypo-osmolality and hyponatremia: Secondary | ICD-10-CM | POA: Diagnosis present

## 2014-11-10 DIAGNOSIS — R112 Nausea with vomiting, unspecified: Secondary | ICD-10-CM | POA: Diagnosis present

## 2014-11-10 DIAGNOSIS — D649 Anemia, unspecified: Secondary | ICD-10-CM | POA: Diagnosis present

## 2014-11-10 DIAGNOSIS — N179 Acute kidney failure, unspecified: Secondary | ICD-10-CM | POA: Diagnosis present

## 2014-11-10 DIAGNOSIS — A419 Sepsis, unspecified organism: Principal | ICD-10-CM | POA: Diagnosis present

## 2014-11-10 DIAGNOSIS — H5789 Other specified disorders of eye and adnexa: Secondary | ICD-10-CM | POA: Diagnosis present

## 2014-11-10 DIAGNOSIS — I959 Hypotension, unspecified: Secondary | ICD-10-CM | POA: Diagnosis not present

## 2014-11-10 DIAGNOSIS — J45909 Unspecified asthma, uncomplicated: Secondary | ICD-10-CM | POA: Diagnosis present

## 2014-11-10 DIAGNOSIS — H00033 Abscess of eyelid right eye, unspecified eyelid: Secondary | ICD-10-CM | POA: Diagnosis present

## 2014-11-10 DIAGNOSIS — D72819 Decreased white blood cell count, unspecified: Secondary | ICD-10-CM | POA: Diagnosis present

## 2014-11-10 DIAGNOSIS — H11421 Conjunctival edema, right eye: Secondary | ICD-10-CM

## 2014-11-10 DIAGNOSIS — L03213 Periorbital cellulitis: Secondary | ICD-10-CM | POA: Diagnosis present

## 2014-11-10 DIAGNOSIS — Z6820 Body mass index (BMI) 20.0-20.9, adult: Secondary | ICD-10-CM

## 2014-11-10 LAB — CBC WITH DIFFERENTIAL/PLATELET
BASOS PCT: 1 % (ref 0–1)
Basophils Absolute: 0.1 10*3/uL (ref 0.0–0.1)
EOS ABS: 0.1 10*3/uL (ref 0.0–0.7)
Eosinophils Relative: 3 % (ref 0–5)
HEMATOCRIT: 31.2 % — AB (ref 36.0–46.0)
HEMOGLOBIN: 10.8 g/dL — AB (ref 12.0–15.0)
LYMPHS ABS: 1.6 10*3/uL (ref 0.7–4.0)
Lymphocytes Relative: 42 % (ref 12–46)
MCH: 27.3 pg (ref 26.0–34.0)
MCHC: 34.6 g/dL (ref 30.0–36.0)
MCV: 78.8 fL (ref 78.0–100.0)
MONO ABS: 0.4 10*3/uL (ref 0.1–1.0)
Monocytes Relative: 9 % (ref 3–12)
Neutro Abs: 1.7 10*3/uL (ref 1.7–7.7)
Neutrophils Relative %: 45 % (ref 43–77)
Platelets: 227 10*3/uL (ref 150–400)
RBC: 3.96 MIL/uL (ref 3.87–5.11)
RDW: 13.3 % (ref 11.5–15.5)
WBC: 3.8 10*3/uL — ABNORMAL LOW (ref 4.0–10.5)

## 2014-11-10 LAB — COMPREHENSIVE METABOLIC PANEL
ALBUMIN: 4.3 g/dL (ref 3.5–5.0)
ALK PHOS: 61 U/L (ref 38–126)
ALT: 27 U/L (ref 14–54)
AST: 81 U/L — AB (ref 15–41)
Anion gap: 7 (ref 5–15)
BUN: 12 mg/dL (ref 6–20)
CALCIUM: 9.2 mg/dL (ref 8.9–10.3)
CHLORIDE: 96 mmol/L — AB (ref 101–111)
CO2: 25 mmol/L (ref 22–32)
CREATININE: 0.94 mg/dL (ref 0.44–1.00)
GFR calc Af Amer: >60 mL/min (ref >60)
GFR calc non Af Amer: >60 mL/min (ref >60)
GLUCOSE: 79 mg/dL (ref 65–99)
Potassium: 3.6 mmol/L (ref 3.5–5.1)
SODIUM: 128 mmol/L — AB (ref 135–145)
Total Bilirubin: 0.5 mg/dL (ref 0.3–1.2)
Total Protein: 7.6 g/dL (ref 6.5–8.1)

## 2014-11-10 LAB — LIPASE, BLOOD: LIPASE: 37 U/L (ref 22–51)

## 2014-11-10 MED ORDER — VANCOMYCIN HCL IN DEXTROSE 1-5 GM/200ML-% IV SOLN
1000.0000 mg | Freq: Once | INTRAVENOUS | Status: AC
  Administered 2014-11-10: 1000 mg via INTRAVENOUS
  Filled 2014-11-10: qty 200

## 2014-11-10 MED ORDER — SODIUM CHLORIDE 0.9 % IV SOLN: INTRAVENOUS | Status: DC

## 2014-11-10 MED ORDER — HEPARIN SODIUM (PORCINE) 5000 UNIT/ML IJ SOLN
5000.0000 [IU] | Freq: Three times a day (TID) | INTRAMUSCULAR | Status: DC
  Administered 2014-11-11 – 2014-11-15 (×13): 5000 [IU] via SUBCUTANEOUS
  Filled 2014-11-10 (×13): qty 1

## 2014-11-10 MED ORDER — ONDANSETRON HCL 4 MG/2ML IJ SOLN
4.0000 mg | Freq: Four times a day (QID) | INTRAMUSCULAR | Status: DC | PRN
  Administered 2014-11-14: 4 mg via INTRAVENOUS
  Filled 2014-11-10: qty 2

## 2014-11-10 MED ORDER — SODIUM CHLORIDE 0.9 % IV BOLUS (SEPSIS)
1000.0000 mL | Freq: Once | INTRAVENOUS | Status: AC
  Administered 2014-11-10: 1000 mL via INTRAVENOUS

## 2014-11-10 MED ORDER — ONDANSETRON HCL 4 MG PO TABS: 4.0000 mg | ORAL_TABLET | Freq: Four times a day (QID) | ORAL | Status: DC | PRN

## 2014-11-10 MED ORDER — ONDANSETRON HCL 4 MG/2ML IJ SOLN
4.0000 mg | Freq: Once | INTRAMUSCULAR | Status: AC
  Administered 2014-11-10: 4 mg via INTRAVENOUS
  Filled 2014-11-10: qty 2

## 2014-11-10 MED ORDER — SODIUM CHLORIDE 0.9 % IJ SOLN
3.0000 mL | Freq: Two times a day (BID) | INTRAMUSCULAR | Status: DC
  Administered 2014-11-11 – 2014-11-15 (×5): 3 mL via INTRAVENOUS

## 2014-11-10 MED ORDER — OXYCODONE HCL 5 MG PO TABS
5.0000 mg | ORAL_TABLET | ORAL | Status: DC | PRN
  Administered 2014-11-13 – 2014-11-14 (×4): 5 mg via ORAL
  Filled 2014-11-10 (×4): qty 1

## 2014-11-10 MED ORDER — IOHEXOL 300 MG/ML  SOLN
100.0000 mL | Freq: Once | INTRAMUSCULAR | Status: AC | PRN
  Administered 2014-11-10: 100 mL via INTRAVENOUS

## 2014-11-10 MED ORDER — ACETAMINOPHEN 325 MG PO TABS
650.0000 mg | ORAL_TABLET | Freq: Four times a day (QID) | ORAL | Status: DC | PRN
  Administered 2014-11-13: 650 mg via ORAL
  Filled 2014-11-10: qty 2

## 2014-11-10 MED ORDER — SODIUM CHLORIDE 0.9 % IV BOLUS (SEPSIS)
1000.0000 mL | Freq: Once | INTRAVENOUS | Status: AC
Start: 2014-11-10 — End: 2014-11-11
  Administered 2014-11-11: 1000 mL via INTRAVENOUS

## 2014-11-10 MED ORDER — DEXTROSE 5 % IV SOLN
1.0000 g | Freq: Once | INTRAVENOUS | Status: AC
  Administered 2014-11-10: 1 g via INTRAVENOUS
  Filled 2014-11-10: qty 10

## 2014-11-10 MED ORDER — PIPERACILLIN-TAZOBACTAM 3.375 G IVPB 30 MIN
3.3750 g | Freq: Once | INTRAVENOUS | Status: AC
  Administered 2014-11-11: 3.375 g via INTRAVENOUS
  Filled 2014-11-10: qty 50

## 2014-11-10 MED ORDER — MORPHINE SULFATE (PF) 4 MG/ML IV SOLN
4.0000 mg | Freq: Once | INTRAVENOUS | Status: AC
  Administered 2014-11-10: 4 mg via INTRAVENOUS
  Filled 2014-11-10: qty 1

## 2014-11-10 MED ORDER — MORPHINE SULFATE (PF) 2 MG/ML IV SOLN
2.0000 mg | Freq: Once | INTRAVENOUS | Status: AC
  Administered 2014-11-10: 2 mg via INTRAVENOUS
  Filled 2014-11-10: qty 1

## 2014-11-10 MED ORDER — ACETAMINOPHEN 650 MG RE SUPP: 650.0000 mg | Freq: Four times a day (QID) | RECTAL | Status: DC | PRN

## 2014-11-10 NOTE — ED Provider Notes (Signed)
CSN: [id number]     Arrival date & time 11/10/14  1554 History   First MD Initiated Contact with Patient 11/10/14 1603     Chief Complaint  Patient presents with  . Eye Problem     (Consider location/radiation/quality/duration/timing/severity/associated sxs/prior Treatment) HPI   Jody Powell 39 y.o.female  PCP: PROVIDER NOT IN SYSTEM  Blood pressure 97/67, pulse 72, temperature 98.4 F (36.9 C), temperature source Oral, resp. rate 20, SpO2 98 %.  SIGNIFICANT PMH: asthma, multiple gastric ulcers CHIEF COMPLAINT: eye pain and swelling  When: Patient developed eye pain and swelling two days ago that has been progressively worsening.  How: uknown Chronicity: acute Location: right eye Radiation: does not radiate Quality and severity: severe, pain is 7/10 Alleviating factors: none  Worsening factors: none Treatments tried: none tried Associated Symptoms: redness, drainage from the eye, swelling and pain. Negative ROS: Confusion, diaphoresis, fever, headache, weakness (general or focal),   neck pain, dysphagia, aphagia, chest pain, shortness of breath,  back pain, abdominal pains, nausea, vomiting, diarrhea, lower extremity swelling, rash.   Past Medical History  Diagnosis Date  . Asthma   . Multiple gastric ulcers    Past Surgical History  Procedure Laterality Date  . Cholecystectomy     Family History  Problem Relation Age of Onset  . Cancer Neg Hx   . Diabetes Neg Hx   . Heart failure Neg Hx   . Hyperlipidemia Neg Hx    Social History  Substance Use Topics  . Smoking status: Never Smoker   . Smokeless tobacco: Never Used  . Alcohol Use: No   OB History    No data available     Review of Systems  10 Systems reviewed and are negative for acute change except as noted in the HPI.    Allergies  Other  Home Medications   Prior to Admission medications   Medication Sig Start Date End Date Taking? Authorizing Provider  dexlansoprazole (DEXILANT) 60 MG  capsule Take 60 mg by mouth daily.   Yes Historical Provider, MD  famotidine (PEPCID) 20 MG tablet Take 1 tablet (20 mg total) by mouth 2 (two) times daily. Patient not taking: Reported on 11/10/2014 09/21/14   Charlestine Night, PA-C  ondansetron (ZOFRAN) 4 MG tablet Take 1 tablet (4 mg total) by mouth every 6 (six) hours. Patient not taking: Reported on 11/10/2014 09/22/14   Catha Gosselin, PA-C  promethazine (PHENERGAN) 25 MG tablet Take 1 tablet (25 mg total) by mouth every 8 (eight) hours as needed for nausea or vomiting. Patient not taking: Reported on 11/10/2014 09/21/14   Charlestine Night, PA-C  traMADol (ULTRAM) 50 MG tablet Take 1 tablet (50 mg total) by mouth every 6 (six) hours as needed. Patient not taking: Reported on 11/10/2014 10/07/14   Emilia Beck, PA-C   BP 86/58 mmHg  Pulse 50  Temp(Src) 97.8 F (36.6 C) (Oral)  Resp 18  SpO2 93% Physical Exam  Constitutional: She appears well-developed and well-nourished. She appears ill. No distress.  HENT:  Head: Normocephalic and atraumatic. Head is with right periorbital erythema (significant periorbital erythema).  Right Ear: Tympanic membrane normal.  Left Ear: Tympanic membrane normal.  Nose: Nose normal.  Mouth/Throat: Uvula is midline and oropharynx is clear and moist.  Eyes: Right eye exhibits discharge. Right conjunctiva is injected.  Unable to evaluate EOMI's or PERRL to right eye due to significant pain, yellow dsicharge and chemosis to right eye  Neck: Normal range of motion. Neck supple.  Cardiovascular: Normal  rate and regular rhythm.   Pulmonary/Chest: Effort normal.  Abdominal: Soft.  Neurological: She is alert.  Skin: Skin is warm and dry.  Nursing note and vitals reviewed.   ED Course  Procedures (including critical care time) Labs Review Labs Reviewed  COMPREHENSIVE METABOLIC PANEL - Abnormal; Notable for the following:    Sodium 128 (*)    Chloride 96 (*)    AST 81 (*)    All other components  within normal limits  CBC WITH DIFFERENTIAL/PLATELET - Abnormal; Notable for the following:    WBC 3.8 (*)    Hemoglobin 10.8 (*)    HCT 31.2 (*)    All other components within normal limits  CULTURE, BLOOD (ROUTINE X 2)  CULTURE, BLOOD (ROUTINE X 2)  LIPASE, BLOOD  LACTIC ACID, PLASMA  LACTIC ACID, PLASMA  PROCALCITONIN  PROTIME-INR  APTT  CORTISOL    Imaging Review Ct Orbits W/cm  11/10/2014   CLINICAL DATA:  39 year old female with right eye swelling for 2 days. No known trauma.  EXAM: CT ORBITS WITH CONTRAST  TECHNIQUE: Multidetector CT imaging of the orbits was performed following the bolus administration of intravenous contrast.  CONTRAST:  OMNIPAQUE IOHEXOL 300 MG/ML  SOLN  COMPARISON:  None.  FINDINGS: Right preseptal facial soft tissue swelling is identified without evidence of abscess. There is no evidence of postseptal or intraconal abnormality. The globes bilaterally retain their spherical shape.  The paranasal sinuses, mastoid air cells and middle/inner ears are clear.  There is no evidence of fracture or bony abnormality.  The visualized portions of the brain are unremarkable.  IMPRESSION: Right preseptal facial soft tissue swelling without abscess - question cellulitis.  No other significant abnormalities.   Electronically Signed   By: Harmon Pier M.D.   On: 11/10/2014 21:45   I have personally reviewed and evaluated these images and lab results as part of my medical decision-making.   EKG Interpretation None      MDM   Final diagnoses:  Eye swelling  Preseptal cellulitis of right eye  Chemosis, right    Medications  heparin injection 5,000 Units (not administered)  sodium chloride 0.9 % injection 3 mL (not administered)  acetaminophen (TYLENOL) tablet 650 mg (not administered)    Or  acetaminophen (TYLENOL) suppository 650 mg (not administered)  oxyCODONE (Oxy IR/ROXICODONE) immediate release tablet 5 mg (not administered)  ondansetron (ZOFRAN) tablet 4  mg (not administered)    Or  ondansetron (ZOFRAN) injection 4 mg (not administered)  sodium chloride 0.9 % bolus 1,000 mL (not administered)  sodium chloride 0.9 % bolus 1,000 mL (0 mLs Intravenous Stopped 11/10/14 2015)  morphine 4 MG/ML injection 4 mg (4 mg Intravenous Given 11/10/14 1839)  ondansetron (ZOFRAN) injection 4 mg (4 mg Intravenous Given 11/10/14 1839)  sodium chloride 0.9 % bolus 1,000 mL (0 mLs Intravenous Stopped 11/10/14 2159)  vancomycin (VANCOCIN) IVPB 1000 mg/200 mL premix (0 mg Intravenous Stopped 11/10/14 2200)  cefTRIAXone (ROCEPHIN) 1 g in dextrose 5 % 50 mL IVPB (0 g Intravenous Stopped 11/10/14 2015)  iohexol (OMNIPAQUE) 300 MG/ML solution 100 mL (100 mLs Intravenous Contrast Given 11/10/14 2058)  morphine 2 MG/ML injection 2 mg (2 mg Intravenous Given 11/10/14 2206)  ondansetron (ZOFRAN) injection 4 mg (4 mg Intravenous Given 11/10/14 2204)    Concern for orbital cellulitis. Pain meds, fluids, vanc and rocephin given IV. CT orbits pending. Pt is afebrile and does not appear septic.  11:00 pm- The CT scan shows preseptal cellulitis wo evidence  of orbital cellulitis. Dr. Clydene Pugh has seen patient as well and recommend Clindamycin PO/ IV and erythromycin ointment in eye QID.  Labs show no elevated white count, CMP is not acutely abnormal. Vital signs are near her baseline.  Filed Vitals:   11/10/14 2300  BP: 86/58  Pulse: 50  Temp:   Resp:      11: 30 pm- patients BP dropped to 86/58, will require stepdown bed and admission. Dr. Allena Katz with [facility] has agreed to admit for inpatient abx.  Marlon Pel, PA-C 11/10/14 1610  Lyndal Pulley, MD 11/11/14 [phone]

## 2014-11-10 NOTE — Progress Notes (Signed)
Patient noted to have been seen in the ED 5 times within the last six months.  Patient has Medicaid [employer].  PCP listed on patient's insurance card is [facility].

## 2014-11-10 NOTE — ED Notes (Signed)
Bed: WJ19 Expected date:  Expected time:  Means of arrival:  Comments: EMS-eye pain/epigastric pain

## 2014-11-10 NOTE — ED Notes (Signed)
Patient also c/o chronic heartburn per husband.  Per husband, patient was given Dexilant sample, but states it hasn't been helping.

## 2014-11-10 NOTE — ED Notes (Signed)
Patient presents to ED via [facility] with c/o right eye swelling and pain.  Patient also c/o chronic heartburn.

## 2014-11-11 DIAGNOSIS — D72819 Decreased white blood cell count, unspecified: Secondary | ICD-10-CM | POA: Diagnosis present

## 2014-11-11 DIAGNOSIS — E871 Hypo-osmolality and hyponatremia: Secondary | ICD-10-CM | POA: Diagnosis present

## 2014-11-11 DIAGNOSIS — H5789 Other specified disorders of eye and adnexa: Secondary | ICD-10-CM | POA: Diagnosis present

## 2014-11-11 LAB — COMPREHENSIVE METABOLIC PANEL
ALT: 25 U/L (ref 14–54)
ANION GAP: 8 (ref 5–15)
AST: 71 U/L — ABNORMAL HIGH (ref 15–41)
Albumin: 4.2 g/dL (ref 3.5–5.0)
Alkaline Phosphatase: 58 U/L (ref 38–126)
BUN: 7 mg/dL (ref 6–20)
CHLORIDE: 107 mmol/L (ref 101–111)
CO2: 23 mmol/L (ref 22–32)
Calcium: 8.3 mg/dL — ABNORMAL LOW (ref 8.9–10.3)
Creatinine, Ser: 1.06 mg/dL — ABNORMAL HIGH (ref 0.44–1.00)
GFR calc non Af Amer: >60 mL/min (ref >60)
Glucose, Bld: 77 mg/dL (ref 65–99)
Potassium: 3.7 mmol/L (ref 3.5–5.1)
SODIUM: 138 mmol/L (ref 135–145)
Total Bilirubin: 0.7 mg/dL (ref 0.3–1.2)
Total Protein: 7.5 g/dL (ref 6.5–8.1)

## 2014-11-11 LAB — PROTIME-INR
INR: 1.23 (ref 0.00–1.49)
Prothrombin Time: 15.6 seconds — ABNORMAL HIGH (ref 11.6–15.2)

## 2014-11-11 LAB — CBC WITH DIFFERENTIAL/PLATELET
BASOS PCT: 2 % — AB (ref 0–1)
Basophils Absolute: 0.1 10*3/uL (ref 0.0–0.1)
EOS ABS: 0.2 10*3/uL (ref 0.0–0.7)
EOS PCT: 5 % (ref 0–5)
HCT: 32.9 % — ABNORMAL LOW (ref 36.0–46.0)
Hemoglobin: 11.1 g/dL — ABNORMAL LOW (ref 12.0–15.0)
LYMPHS ABS: 1.2 10*3/uL (ref 0.7–4.0)
Lymphocytes Relative: 34 % (ref 12–46)
MCH: 27.3 pg (ref 26.0–34.0)
MCHC: 33.7 g/dL (ref 30.0–36.0)
MCV: 80.8 fL (ref 78.0–100.0)
MONOS PCT: 8 % (ref 3–12)
Monocytes Absolute: 0.3 10*3/uL (ref 0.1–1.0)
Neutro Abs: 1.8 10*3/uL (ref 1.7–7.7)
Neutrophils Relative %: 51 % (ref 43–77)
PLATELETS: 250 10*3/uL (ref 150–400)
RBC: 4.07 MIL/uL (ref 3.87–5.11)
RDW: 13.8 % (ref 11.5–15.5)
WBC: 3.5 10*3/uL — AB (ref 4.0–10.5)

## 2014-11-11 LAB — MRSA PCR SCREENING: MRSA BY PCR: NEGATIVE

## 2014-11-11 LAB — TSH: TSH: 3.801 u[IU]/mL (ref 0.350–4.500)

## 2014-11-11 LAB — APTT: APTT: 41 s — AB (ref 24–37)

## 2014-11-11 LAB — PROCALCITONIN: Procalcitonin: <0.1 ng/mL

## 2014-11-11 LAB — LACTIC ACID, PLASMA
Lactic Acid, Venous: 0.5 mmol/L (ref 0.5–2.0)
Lactic Acid, Venous: 1.3 mmol/L (ref 0.5–2.0)

## 2014-11-11 LAB — CORTISOL: Cortisol, Plasma: 0.7 ug/dL

## 2014-11-11 LAB — OSMOLALITY, URINE: Osmolality, Ur: 296 mOsm/kg — ABNORMAL LOW (ref 390–1090)

## 2014-11-11 MED ORDER — VANCOMYCIN HCL IN DEXTROSE 1-5 GM/200ML-% IV SOLN
1000.0000 mg | INTRAVENOUS | Status: DC
  Administered 2014-11-11 – 2014-11-13 (×3): 1000 mg via INTRAVENOUS
  Filled 2014-11-11 (×3): qty 200

## 2014-11-11 MED ORDER — SODIUM CHLORIDE 0.9 % IV SOLN
INTRAVENOUS | Status: DC
  Administered 2014-11-11 – 2014-11-13 (×4): via INTRAVENOUS
  Administered 2014-11-14: 75 mL/h via INTRAVENOUS
  Administered 2014-11-15: 05:00:00 via INTRAVENOUS

## 2014-11-11 MED ORDER — SODIUM CHLORIDE 0.9 % IV BOLUS (SEPSIS)
500.0000 mL | Freq: Once | INTRAVENOUS | Status: AC
  Administered 2014-11-11: 500 mL via INTRAVENOUS

## 2014-11-11 MED ORDER — FAMOTIDINE IN NACL 20-0.9 MG/50ML-% IV SOLN
20.0000 mg | Freq: Two times a day (BID) | INTRAVENOUS | Status: DC
  Administered 2014-11-11 – 2014-11-14 (×8): 20 mg via INTRAVENOUS
  Filled 2014-11-11 (×8): qty 50

## 2014-11-11 MED ORDER — SODIUM CHLORIDE 0.9 % IV BOLUS (SEPSIS)
500.0000 mL | Freq: Once | INTRAVENOUS | Status: AC
  Administered 2014-11-12: 500 mL via INTRAVENOUS

## 2014-11-11 MED ORDER — PIPERACILLIN-TAZOBACTAM 3.375 G IVPB
3.3750 g | Freq: Three times a day (TID) | INTRAVENOUS | Status: DC
  Administered 2014-11-11 – 2014-11-14 (×10): 3.375 g via INTRAVENOUS
  Filled 2014-11-11 (×9): qty 50

## 2014-11-11 NOTE — Progress Notes (Addendum)
Patient ID: Jody Powell, female   DOB: 12/02/1975, 39 y.o.   MRN: [id number]  Pt admitted after midnight. Please refer to admission note done today 11/11/2014 for more details.  39 y.o. female with past medical history of asthma, gastric ulcers who presented with right eye swelling. She recently moved to [place] 2 months ago from [place]. Pt reports no trauma to the eye.  Assessment and Plan:  Right eye swelling, cellulitis - Continue zosyn and vanco for now - Slight hypotension so we will keep in SDU for next 24 hours.  Manson Passey [facility] [phone]

## 2014-11-11 NOTE — Progress Notes (Signed)
Notified Dr. Elisabeth Pigeon via text page of patient's 1.5 sec pause on EKG strip.  Will continue to monitor patient and notified of changes

## 2014-11-11 NOTE — H&P (Signed)
[facility] History and Physical  Patient: Jody Powell  MRN: [id number]  DOB: 1975-11-10  DOS: the patient was seen and examined on 11/11/2014 PCP: PROVIDER NOT IN SYSTEM  Referring physician: Dr. Hinda Glatter Chief Complaint: Right eye swelling  HPI: Jody Powell is a 39 y.o. female with Past medical history of asthma, gastric ulcers. The patient is presenting with complaints of right eye swelling. The history was taken with the help of patient's husband. The patient is a native of [place] and has recently moved to [place] 2 months ago. Since last 2 days the patient has been complaining of pain in the outer angle of the right eye. There was no pain with eye movement reported. There is no trauma or injury reported. Throughout the day today on 11/10/2014 the patient had worsening swelling of the right eye with worsening pain at the angle of the right eye and therefore she came to the hospital. No fall no trauma no injury no diarrhea no constipation abdominal pain no chest pain or shortness of breath. No focal deficit. No similar skin infection in the past.  The patient is coming from home.  At her baseline ambulates without any support And is independent for most of her ADL manages her medication on her own.  Review of Systems: as mentioned in the history of present illness.  A comprehensive review of the other systems is negative.  Past Medical History  Diagnosis Date  . Asthma   . Multiple gastric ulcers    Past Surgical History  Procedure Laterality Date  . Cholecystectomy     Social History:  reports that she has never smoked. She has never used smokeless tobacco. She reports that she does not drink alcohol or use illicit drugs.  Allergies  Allergen Reactions  . Other Other (See Comments)    Pt's husband states she is allergic to the "anti-malaria medicatioin"    Family History  Problem Relation Age of Onset  . Cancer Neg Hx   . Diabetes Neg Hx   . Heart failure Neg Hx     . Hyperlipidemia Neg Hx     Prior to Admission medications   Medication Sig Start Date End Date Taking? Authorizing Provider  dexlansoprazole (DEXILANT) 60 MG capsule Take 60 mg by mouth daily.   Yes Historical Provider, MD  famotidine (PEPCID) 20 MG tablet Take 1 tablet (20 mg total) by mouth 2 (two) times daily. Patient not taking: Reported on 11/10/2014 09/21/14   Charlestine Night, PA-C  ondansetron (ZOFRAN) 4 MG tablet Take 1 tablet (4 mg total) by mouth every 6 (six) hours. Patient not taking: Reported on 11/10/2014 09/22/14   Catha Gosselin, PA-C  promethazine (PHENERGAN) 25 MG tablet Take 1 tablet (25 mg total) by mouth every 8 (eight) hours as needed for nausea or vomiting. Patient not taking: Reported on 11/10/2014 09/21/14   Charlestine Night, PA-C  traMADol (ULTRAM) 50 MG tablet Take 1 tablet (50 mg total) by mouth every 6 (six) hours as needed. Patient not taking: Reported on 11/10/2014 10/07/14   Emilia Beck, PA-C    Physical Exam: Filed Vitals:   11/10/14 2208 11/10/14 2300 11/11/14 0009 11/11/14 0017  BP: 90/66 86/58  95/67  Pulse: 67 50  65  Temp:      TempSrc:      Resp: 18   18  Weight:   47.999 kg (105 lb 13.1 oz)   SpO2: 96% 93%  94%    General: Alert, Awake and Oriented to Time, Place  and Person. Appear in moderate distress Eyes: Significant swelling of the right eye with difficulty examining it completely. Left eye pupils are reactive to light. Extraocular eye movement not painful. ENT: Oral Mucosa clear moist. Neck: no JVD Cardiovascular: S1 and S2 Present, no Murmur, Peripheral Pulses Present Respiratory: Bilateral Air entry equal and Decreased,  Clear to Auscultation, no Crackles, no wheezes Abdomen: Bowel Sound present, Soft and no tenderness Skin: no Rash Extremities: no Pedal edema, no calf tenderness Neurologic: Grossly no focal neuro deficit.  Labs on Admission:  CBC:  Recent Labs Lab 11/10/14 1729  WBC 3.8*  NEUTROABS 1.7  HGB 10.8*   HCT 31.2*  MCV 78.8  PLT 227    CMP     Component Value Date/Time   NA 128* 11/10/2014 1729   K 3.6 11/10/2014 1729   CL 96* 11/10/2014 1729   CO2 25 11/10/2014 1729   GLUCOSE 79 11/10/2014 1729   BUN 12 11/10/2014 1729   CREATININE 0.94 11/10/2014 1729   CALCIUM 9.2 11/10/2014 1729   PROT 7.6 11/10/2014 1729   ALBUMIN 4.3 11/10/2014 1729   AST 81* 11/10/2014 1729   ALT 27 11/10/2014 1729   ALKPHOS 61 11/10/2014 1729   BILITOT 0.5 11/10/2014 1729   GFRNONAA >60 11/10/2014 1729   GFRAA >60 11/10/2014 1729     Recent Labs Lab 11/10/14 1729  LIPASE 37    No results for input(s): CKTOTAL, CKMB, CKMBINDEX, TROPONINI in the last 168 hours. BNP (last 3 results) No results for input(s): BNP in the last 8760 hours.  ProBNP (last 3 results) No results for input(s): PROBNP in the last 8760 hours.   Radiological Exams on Admission: Ct Orbits W/cm  11/10/2014   CLINICAL DATA:  39 year old female with right eye swelling for 2 days. No known trauma.  EXAM: CT ORBITS WITH CONTRAST  TECHNIQUE: Multidetector CT imaging of the orbits was performed following the bolus administration of intravenous contrast.  CONTRAST:  OMNIPAQUE IOHEXOL 300 MG/ML  SOLN  COMPARISON:  None.  FINDINGS: Right preseptal facial soft tissue swelling is identified without evidence of abscess. There is no evidence of postseptal or intraconal abnormality. The globes bilaterally retain their spherical shape.  The paranasal sinuses, mastoid air cells and middle/inner ears are clear.  There is no evidence of fracture or bony abnormality.  The visualized portions of the brain are unremarkable.  IMPRESSION: Right preseptal facial soft tissue swelling without abscess - question cellulitis.  No other significant abnormalities.   Electronically Signed   By: Harmon Pier M.D.   On: 11/10/2014 21:45   Assessment/Plan Principal Problem:   Sepsis Active Problems:   Preseptal cellulitis of right eye    Hyponatremia   1. Sepsis The patient is presenting with complaints of right eye swelling. The swelling has progressed rapidly over 1 day. CT scan of the eye is showing a right preseptal facial cellulitis. The patient is hypotensive and source of infection. She also is hyponatremic. With this she does not meet SIRS criteria although appears to have a score of 2 on SOFA. Patient will be admitted in stepdown unit. I would aggressively hydrate her with IV fluids, continue with broad-spectrum antibiotics IV vancomycin and Zosyn. Follow cultures. Follow lactic acid  2. hyponatremia. Rechecking CMP with osmolality. Patient has chronic hyponatremia.  3. preseptal cellulitis. At present extraocular eye movements are not painful. Monitor closely.  Advance goals of care discussion: Full   DVT Prophylaxis: subcutaneous Heparin Nutrition: Advance as tolerated   Family  Communication: family was present at bedside, opportunity was given to ask question and all questions were answered satisfactorily at the time of interview. Disposition: Admitted as inpatient, step-down unit.  Author: Lynden Oxford, MD [facility] Pager: [phone] 11/11/2014  If 7PM-7AM, please contact night-coverage [email or web address] Password [email or web address]

## 2014-11-11 NOTE — Progress Notes (Signed)
ANTIBIOTIC CONSULT NOTE - INITIAL  Pharmacy Consult for Vancomycin and Zosyn  Indication: preseptal cellulitis of right eye  Allergies  Allergen Reactions  . Other Other (See Comments)    Pt's husband states she is allergic to the "anti-malaria medicatioin"    Patient Measurements: Height: 4' 11.06" (150 cm) Weight: 109 lb 9.1 oz (49.7 kg) IBW/kg (Calculated) : 43.33 Adjusted Body Weight:   Vital Signs: Temp: 97.5 F (36.4 C) (09/03 0200) Temp Source: Oral (09/03 0200) BP: 83/60 mmHg (09/03 0432) Pulse Rate: 71 (09/03 0100) Intake/Output from previous day: 09/02 0701 - 09/03 0700 In: 2307.5 [I.V.:2207.5; IV Piggyback:100] Out: 500 [Urine:500] Intake/Output from this shift: Total I/O In: 2307.5 [I.V.:2207.5; IV Piggyback:100] Out: 500 [Urine:500]  Labs:  Recent Labs  11/10/14 1729  WBC 3.8*  HGB 10.8*  PLT 227  CREATININE 0.94   Estimated Creatinine Clearance: 54.4 mL/min (by C-G formula based on Cr of 0.94). No results for input(s): VANCOTROUGH, VANCOPEAK, VANCORANDOM, GENTTROUGH, GENTPEAK, GENTRANDOM, TOBRATROUGH, TOBRAPEAK, TOBRARND, AMIKACINPEAK, AMIKACINTROU, AMIKACIN in the last 72 hours.   Microbiology: No results found for this or any previous visit (from the past 720 hour(s)).  Medical History: Past Medical History  Diagnosis Date  . Asthma   . Multiple gastric ulcers     Medications:  Anti-infectives    Start     Dose/Rate Route Frequency Ordered Stop   11/11/14 2000  vancomycin (VANCOCIN) IVPB 1000 mg/200 mL premix     1,000 mg 200 mL/hr over 60 Minutes Intravenous Every 24 hours 11/11/14 0553     11/11/14 0600  piperacillin-tazobactam (ZOSYN) IVPB 3.375 g     3.375 g 12.5 mL/hr over 240 Minutes Intravenous 3 times per day 11/11/14 0246     11/11/14 0000  piperacillin-tazobactam (ZOSYN) IVPB 3.375 g     3.375 g 100 mL/hr over 30 Minutes Intravenous  Once 11/10/14 2348 11/11/14 0041   11/10/14 1815  cefTRIAXone (ROCEPHIN) 1 g in dextrose 5 %  50 mL IVPB     1 g 100 mL/hr over 30 Minutes Intravenous  Once 11/10/14 1800 11/10/14 2015   11/10/14 1800  vancomycin (VANCOCIN) IVPB 1000 mg/200 mL premix     1,000 mg 200 mL/hr over 60 Minutes Intravenous  Once 11/10/14 1800 11/10/14 2200     Assessment: Patient with right eye swelling and sepsis.  First dose of antibiotics already given.   Goal of Therapy:  Vancomycin trough level 15-20 mcg/ml  Zosyn based on renal function   Plan:  Measure antibiotic drug levels at steady state Follow up culture results Vancomycin 1gm iv q24hr  Zosyn 3.375g IV Q8H infused over 4hrs.   Darlina Guys, Jacquenette Shone Crowford 11/11/2014,5:56 AM

## 2014-11-12 LAB — BASIC METABOLIC PANEL
Anion gap: 6 (ref 5–15)
BUN: 7 mg/dL (ref 6–20)
CALCIUM: 7.5 mg/dL — AB (ref 8.9–10.3)
CHLORIDE: 108 mmol/L (ref 101–111)
CO2: 19 mmol/L — ABNORMAL LOW (ref 22–32)
CREATININE: 1.02 mg/dL — AB (ref 0.44–1.00)
Glucose, Bld: 79 mg/dL (ref 65–99)
Potassium: 3.6 mmol/L (ref 3.5–5.1)
SODIUM: 133 mmol/L — AB (ref 135–145)

## 2014-11-12 LAB — OSMOLALITY: OSMOLALITY: 288 mosm/kg (ref 275–300)

## 2014-11-12 MED ORDER — SODIUM CHLORIDE 0.9 % IV BOLUS (SEPSIS)
500.0000 mL | Freq: Once | INTRAVENOUS | Status: AC
  Administered 2014-11-12: 500 mL via INTRAVENOUS

## 2014-11-12 MED ORDER — HYPROMELLOSE (GONIOSCOPIC) 2.5 % OP SOLN
1.0000 [drp] | OPHTHALMIC | Status: DC | PRN
  Filled 2014-11-12: qty 15

## 2014-11-12 MED ORDER — POLYVINYL ALCOHOL 1.4 % OP SOLN
1.0000 [drp] | OPHTHALMIC | Status: DC | PRN
Start: 2014-11-12 — End: 2014-11-15
  Administered 2014-11-13 – 2014-11-15 (×4): 2 [drp] via OPHTHALMIC
  Filled 2014-11-12: qty 15

## 2014-11-12 NOTE — Progress Notes (Signed)
Patient ID: Jody Powell, female   DOB: 26-Jun-1975, 39 y.o.   MRN: [id number] [facility] PROGRESS NOTE  Jody Powell XHF:[id number] DOB: 19-Feb-1976 DOA: 11/10/2014 PCP: PROVIDER NOT IN SYSTEM - will give info about [place] on discharge   Brief narrative:    39 y.o. female with past medical history of asthma, gastric ulcers who presented with right eye swelling and pain for 2 days prior to this admission. She recently moved to [place] 2 months ago from [place]. Pt reports no trauma to the eye. She was started on vanco and zosyn. She remains in SDU due to hypotension.   Assessment/Plan:    Principal Problem:   Sepsis secondary to preseptal cellulitis / Leukopenia  - Sepsis criteria met on admission with hypotension, mild tachycardia, low grade fever and leukopenia with evidence of cellulitis on clinical exam - Redness and swelling obvious on the right eye - Started on vanco and zosyn - Lactic acid and procalcitonin WNL - Continues to be hypotensive with SBP in 80's  - Will continue IV fluids for now - No requirement for pressure support at this time - Continue to monitor in SDU  Active Problems:   Hyponatremia - Likely prerenal due to dehydration - Sodium improving with IV fluids  - Continue IV fluids     Acute renal failure - Likely prerenal due to dehydration - Continue IV fluids - Creatinine 1.02 - Follow up BMP tomorrow am     DVT Prophylaxis  - Heparin subQ in hospital    Code Status: Full.  Family Communication:  plan of care discussed with the patient and her husband at the bedside  Disposition Plan: remains in SDU due to hypotension   IV access:  Peripheral IV  Procedures and diagnostic studies:    Ct Orbits W/cm 11/10/2014  Right preseptal facial soft tissue swelling without abscess - question cellulitis.  No other significant abnormalities.   Electronically Signed   By: Margarette [place] M.D.   On: 11/10/2014 21:45    Medical Consultants:  None   Other Consultants:   None   IAnti-Infectives:   Vanco 11/10/2014 --> Zosyn 11/10/2014 -->    Leisa Lenz, MD  [facility] Pager [phone]  Time spent in minutes: 25 minutes  If 7PM-7AM, please contact night-coverage [email or web address] Password [email or web address] 11/12/2014, 12:04 PM   LOS: 2 days    HPI/Subjective: No acute overnight events. Patient reports no pain in right eye. She says she is able to see on the right eye and able to minimally open it.   Objective: Filed Vitals:   11/12/14 0500 11/12/14 0530 11/12/14 0600 11/12/14 0800  BP: 82/65  83/58 87/68  Pulse: 71 66 67 75  Temp:    98.4 F (36.9 C)  TempSrc:    Oral  Resp: '13 10 10 13  ' Height:      Weight:      SpO2: 96% 97% 95% 98%    Intake/Output Summary (Last 24 hours) at 11/12/14 1204 Last data filed at 11/12/14 1056  Gross per 24 hour  Intake   1900 ml  Output   1075 ml  Net    825 ml    Exam:   General:  Pt is alert, follows commands appropriately, not in acute distress; right eye closed but able to minimally open it and says she sees thorough   Cardiovascular: Regular rate and rhythm, S1/S2, no murmurs  Respiratory: Clear to auscultation bilaterally, no wheezing, no crackles, no rhonchi  Abdomen: Soft, non tender,  non distended, bowel sounds present  Extremities: No edema, pulses DP and PT palpable bilaterally  Neuro: Grossly nonfocal  Data Reviewed: Basic Metabolic Panel:  Recent Labs Lab 11/10/14 1729 11/11/14 1530 11/12/14 0346  NA 128* 138 133*  K 3.6 3.7 3.6  CL 96* 107 108  CO2 25 23 19*  GLUCOSE 79 77 79  BUN '12 7 7  ' CREATININE 0.94 1.06* 1.02*  CALCIUM 9.2 8.3* 7.5*   Liver Function Tests:  Recent Labs Lab 11/10/14 1729 11/11/14 1530  AST 81* 71*  ALT 27 25  ALKPHOS 61 58  BILITOT 0.5 0.7  PROT 7.6 7.5  ALBUMIN 4.3 4.2    Recent Labs Lab 11/10/14 1729  LIPASE 37   No results for input(s): AMMONIA in the last 168 hours. CBC:  Recent Labs Lab 11/10/14 1729 11/11/14 1530  WBC  3.8* 3.5*  NEUTROABS 1.7 1.8  HGB 10.8* 11.1*  HCT 31.2* 32.9*  MCV 78.8 80.8  PLT 227 250   Cardiac Enzymes: No results for input(s): CKTOTAL, CKMB, CKMBINDEX, TROPONINI in the last 168 hours. BNP: Invalid input(s): POCBNP CBG: No results for input(s): GLUCAP in the last 168 hours.  Recent Results (from the past 240 hour(s))  MRSA PCR Screening     Status: None   Collection Time: 11/11/14  3:42 AM  Result Value Ref Range Status   MRSA by PCR NEGATIVE NEGATIVE Final    Comment:        The GeneXpert MRSA Assay (FDA approved for NASAL specimens only), is one component of a comprehensive MRSA colonization surveillance program. It is not intended to diagnose MRSA infection nor to guide or monitor treatment for MRSA infections.      Scheduled Meds: . famotidine (PEPCID) IV  20 mg Intravenous Q12H  . heparin  5,000 Units Subcutaneous 3 times per day  . piperacillin-tazobactam (ZOSYN)  IV  3.375 g Intravenous 3 times per day  . sodium chloride  3 mL Intravenous Q12H  . vancomycin  1,000 mg Intravenous Q24H   Continuous Infusions: . sodium chloride 75 mL/hr at 11/11/14 1800

## 2014-11-12 NOTE — Progress Notes (Signed)
CRITICAL VALUE ALERT  Critical value received:  Serum Osmolality 288   Date of notification:  11/12/14  Time of notification:  0145  Critical value read back:Yes.    Nurse who received alert:  Max Sane, RN  MD notified (1st page):  [facility] on call  Time of first page:  0230

## 2014-11-12 NOTE — Progress Notes (Signed)
Utilization Review Completed.Kevionna Heffler T9/06/2014  

## 2014-11-13 MED ORDER — ENSURE ENLIVE PO LIQD
237.0000 mL | Freq: Three times a day (TID) | ORAL | Status: DC
  Administered 2014-11-13 – 2014-11-15 (×5): 237 mL via ORAL

## 2014-11-13 NOTE — Progress Notes (Signed)
Initial Nutrition Assessment  DOCUMENTATION CODES:   Severe malnutrition in context of social or environmental circumstances  INTERVENTION:   Provide Ensure Enlive po TID, each supplement provides 350 kcal and 20 grams of protein Encourage PO intake RD to continue to monitor  NUTRITION DIAGNOSIS:   Malnutrition related to social / environmental circumstances as evidenced by moderate depletion of body fat, severe depletion of muscle mass, energy intake < or equal to 50% for > or equal to 1 month.  GOAL:   Patient will meet greater than or equal to 90% of their needs  MONITOR:   PO intake, Supplement acceptance, Labs, Weight trends, Skin, I & O's  REASON FOR ASSESSMENT:   Malnutrition Screening Tool    ASSESSMENT:   39 y.o. female with past medical history of asthma, gastric ulcers who presented with right eye swelling and pain for 2 days prior to this admission. She recently moved to [place] 2 months ago from [place].   Pt in room with family at bedside. Pt does not speak much english but family translated. Pt eating well currently. Pt states that she has not been eating much for the past 2 years in [place]. Pt would primarily drink milk. Pt eating 75-100% now.   Pt would like to try Ensure supplements. RD to order.  Pt states her UBW is 50 kg.  Nutrition-Focused physical exam completed. Findings are moderate fat depletion, severe muscle depletion, and no edema.   Labs reviewed: Low Na Elevated Creatinine  Diet Order:  Diet regular Room service appropriate?: Yes; Fluid consistency:: Thin  Skin:  Reviewed, no issues  Last BM:  9/1  Height:   Ht Readings from Last 1 Encounters:  11/11/14 4' 11.06" (1.5 m)    Weight:   Wt Readings from Last 1 Encounters:  11/13/14 109 lb 2 oz (49.5 kg)    Ideal Body Weight:  44.8 kg  BMI:  Body mass index is 22 kg/(m^2).  Estimated Nutritional Needs:   Kcal:  1500-1700  Protein:  75-85g  Fluid:  1.6L/day  EDUCATION  NEEDS:   No education needs identified at this time  Tilda Franco, MS, RD, LDN Pager: [phone] After Hours Pager: [phone]

## 2014-11-13 NOTE — Progress Notes (Signed)
Patient ID: Jody Powell, female   DOB: 05/26/1975, 39 y.o.   MRN: [id number] [facility] PROGRESS NOTE  Jody Powell CHY:[id number] DOB: 02/22/1976 DOA: 11/10/2014 PCP: PROVIDER NOT IN SYSTEM - will give info about [place] on discharge   Brief narrative:    39 y.o. female with past medical history of asthma, gastric ulcers who presented with right eye swelling and pain for 2 days prior to this admission. She recently moved to [place] 2 months ago from [place]. Pt reports no trauma to the eye. She was started on vanco and zosyn. She was in SDU due to hypotension.   Barrier to discharge: Still hypotensive but stable and not on pressor support. Will transfer to telemetry. Probably baseline hypotension.   Assessment/Plan:    Principal Problem:   Sepsis secondary to preseptal cellulitis / Leukopenia  - Sepsis criteria met on admission with hypotension, mild tachycardia, low grade fever and leukopenia with evidence of right eye cellulitis on clinical exam - Cellulitis of right eye improved since past 24 hours. Started on vanco and zosyn - Lactic acid and procalcitonin WNL - Blood cultures so far show no growth  - Still slightly hypotensive but does not require pressor support - Transfer to telemetry today - Continue IV fluids    Active Problems:   Hyponatremia - Likely prerenal due to dehydration - Sodium improving with IV fluids  - TSH WNL - Check BMP tomorrow am     Acute renal failure - Likely prerenal due to dehydration - Improving with hydration     Normocytic anemia - Hemoglobin stable      DVT Prophylaxis  - Heparin subQ in hospital    Code Status: Full.  Family Communication:  plan of care discussed with the patient and her son at the bedside  Disposition Plan: transfer to telemetry today   IV access:  Peripheral IV  Procedures and diagnostic studies:    Ct Orbits W/cm 11/10/2014  Right preseptal facial soft tissue swelling without abscess - question cellulitis.  No  other significant abnormalities.   Electronically Signed   By: Margarette [place] M.D.   On: 11/10/2014 21:45    Medical Consultants:  None   Other Consultants:  None   IAnti-Infectives:   Vanco 11/10/2014 --> Zosyn 11/10/2014 -->    Jody Lenz, MD  [facility] Pager [phone]  Time spent in minutes: 25 minutes  If 7PM-7AM, please contact night-coverage [email or web address] Password [email or web address] 11/13/2014, 11:11 AM   LOS: 3 days    HPI/Subjective: No acute overnight events. Patient reports feeling better.    Objective: Filed Vitals:   11/13/14 0200 11/13/14 0400 11/13/14 0600 11/13/14 0800  BP: '83/61 81/59 83/67 ' 88/67  Pulse: 75 73 69 63  Temp:  98.3 F (36.8 C)  98.1 F (36.7 C)  TempSrc:      Resp: '12 11 12 11  ' Height:      Weight:  49.5 kg (109 lb 2 oz)    SpO2: 95% 99% 96% 94%    Intake/Output Summary (Last 24 hours) at 11/13/14 1111 Last data filed at 11/13/14 0946  Gross per 24 hour  Intake   2528 ml  Output   1700 ml  Net    828 ml    Exam:   General:  Pt is alert, awake, opens right eye more today  Cardiovascular: Rate controlled, (+) S1, S2  Respiratory: bilateral air entry, no wheezing   Abdomen: non tender abdomen, non distended, (+) BS   Extremities: No leg  swelling, pulses palpable   Neuro: No focal deficits   Data Reviewed: Basic Metabolic Panel:  Recent Labs Lab 11/10/14 1729 11/11/14 1530 11/12/14 0346  NA 128* 138 133*  K 3.6 3.7 3.6  CL 96* 107 108  CO2 25 23 19*  GLUCOSE 79 77 79  BUN '12 7 7  ' CREATININE 0.94 1.06* 1.02*  CALCIUM 9.2 8.3* 7.5*   Liver Function Tests:  Recent Labs Lab 11/10/14 1729 11/11/14 1530  AST 81* 71*  ALT 27 25  ALKPHOS 61 58  BILITOT 0.5 0.7  PROT 7.6 7.5  ALBUMIN 4.3 4.2    Recent Labs Lab 11/10/14 1729  LIPASE 37   No results for input(s): AMMONIA in the last 168 hours. CBC:  Recent Labs Lab 11/10/14 1729 11/11/14 1530  WBC 3.8* 3.5*  NEUTROABS 1.7 1.8  HGB 10.8* 11.1*  HCT  31.2* 32.9*  MCV 78.8 80.8  PLT 227 250   Cardiac Enzymes: No results for input(s): CKTOTAL, CKMB, CKMBINDEX, TROPONINI in the last 168 hours. BNP: Invalid input(s): POCBNP CBG: No results for input(s): GLUCAP in the last 168 hours.  Recent Results (from the past 240 hour(s))  Culture, blood (x 2)     Status: None (Preliminary result)   Collection Time: 11/11/14 12:00 AM  Result Value Ref Range Status   Specimen Description BLOOD LEFT ANTECUBITAL  Final   Special Requests BOTTLES DRAWN AEROBIC AND ANAEROBIC 5ML  Final   Culture   Final    NO GROWTH 1 DAY Performed at [facility]    Report Status PENDING  Incomplete  Culture, blood (x 2)     Status: None (Preliminary result)   Collection Time: 11/11/14 12:03 AM  Result Value Ref Range Status   Specimen Description BLOOD RIGHT ANTECUBITAL  Final   Special Requests BOTTLES DRAWN AEROBIC ONLY 2ML  Final   Culture   Final    NO GROWTH 1 DAY Performed at [facility]    Report Status PENDING  Incomplete  MRSA PCR Screening     Status: None   Collection Time: 11/11/14  3:42 AM  Result Value Ref Range Status   MRSA by PCR NEGATIVE NEGATIVE Final    Comment:        The GeneXpert MRSA Assay (FDA approved for NASAL specimens only), is one component of a comprehensive MRSA colonization surveillance program. It is not intended to diagnose MRSA infection nor to guide or monitor treatment for MRSA infections.      Scheduled Meds: . famotidine (PEPCID) IV  20 mg Intravenous Q12H  . feeding supplement (ENSURE ENLIVE)  237 mL Oral TID BM  . heparin  5,000 Units Subcutaneous 3 times per day  . piperacillin-tazobactam (ZOSYN)  IV  3.375 g Intravenous 3 times per day  . sodium chloride  3 mL Intravenous Q12H  . vancomycin  1,000 mg Intravenous Q24H   Continuous Infusions: . sodium chloride 75 mL/hr at 11/13/14 [id number]

## 2014-11-14 DIAGNOSIS — R112 Nausea with vomiting, unspecified: Secondary | ICD-10-CM

## 2014-11-14 MED ORDER — PANTOPRAZOLE SODIUM 40 MG IV SOLR
40.0000 mg | Freq: Two times a day (BID) | INTRAVENOUS | Status: DC
  Administered 2014-11-14 – 2014-11-15 (×3): 40 mg via INTRAVENOUS
  Filled 2014-11-14 (×3): qty 40

## 2014-11-14 MED ORDER — SODIUM CHLORIDE 0.9 % IV SOLN: Freq: Once | INTRAVENOUS | Status: DC

## 2014-11-14 MED ORDER — ONDANSETRON HCL 4 MG/2ML IJ SOLN: 4.0000 mg | Freq: Four times a day (QID) | INTRAMUSCULAR | Status: DC | PRN

## 2014-11-14 MED ORDER — DOXYCYCLINE HYCLATE 100 MG IV SOLR
100.0000 mg | Freq: Two times a day (BID) | INTRAVENOUS | Status: DC
  Administered 2014-11-14 – 2014-11-15 (×2): 100 mg via INTRAVENOUS
  Filled 2014-11-14 (×3): qty 100

## 2014-11-14 NOTE — Progress Notes (Signed)
Patient ID: Jody Powell, female   DOB: 01-Jul-1975, 39 y.o.   MRN: [id number] [facility] PROGRESS NOTE  Jaydalynn Olivero WEX:[id number] DOB: 11-19-1975 DOA: 11/10/2014 PCP: PROVIDER NOT IN SYSTEM - will give info about [place] on discharge   Brief narrative:    39 y.o. female with past medical history of asthma, gastric ulcers who presented with right eye swelling and pain for 2 days prior to this admission. She recently moved to [place] 2 months ago from [place]. Pt reported no trauma to the eye. She was started on vanco and zosyn. She was in SDU due to hypotension. Transferred out of SDU 11/13/2014.  Barrier to discharge: Stop vanco and zosyn today. Start doxycycline and if she tolerates it change it to PO tomorrow and likely discharge home tomorrow 11/15/2014.   Assessment/Plan:    Principal Problem:   Sepsis secondary to preseptal cellulitis / Leukopenia  - Sepsis criteria met on admission with hypotension, mild tachycardia, low grade fever and leukopenia with evidence of right eye cellulitis on clinical exam. Lactic acid and procalcitonin WNL - Cellulitis of right eye significantly better.  - Stop vanco and zosyn today (she was on this abx regimen since admission). Start doxycycline today and if se tolerates it most likely can go home tomorrow with doxycycline  - Blood cultures so far show no growth    Active Problems:   Nausea and vomiting - Possibly from abx - Change abx to simpler regimen since blood cultures show no growth so far - Given Zofran every 4-6 hours PRN nausea or vomiting.      Hyponatremia - Likely prerenal due to dehydration - Sodium improved with IV fluids  - TSH WNL     Acute renal failure - Likely prerenal due to dehydration - Improved with hydration     Normocytic anemia - Hemoglobin stable      DVT Prophylaxis  - Heparin subQ in hospital    Code Status: Full.  Family Communication:  plan of care discussed with the patient and her son at the bedside   Disposition Plan: home likely 11/15/2014.   IV access:  Peripheral IV  Procedures and diagnostic studies:    Ct Orbits W/cm 11/10/2014  Right preseptal facial soft tissue swelling without abscess - question cellulitis.  No other significant abnormalities.   Electronically Signed   By: Margarette [place] M.D.   On: 11/10/2014 21:45    Medical Consultants:  None   Other Consultants:  None   IAnti-Infectives:   Vanco 11/10/2014 --> 11/14/2014  Zosyn 11/10/2014 -->  11/14/2014  Doxycycline 11/14/2014 -->    Leisa Lenz, MD  [facility] Pager [phone]  Time spent in minutes: 15 minutes  If 7PM-7AM, please contact night-coverage [email or web address] Password [email or web address] 11/14/2014, 3:37 PM   LOS: 4 days    HPI/Subjective: No acute overnight events. Patient reports nausea and vomiting this am.   Objective: Filed Vitals:   11/14/14 0513 11/14/14 0715 11/14/14 0820 11/14/14 1405  BP: 75/57 73/55 106/71 97/66  Pulse: 65 51 75 71  Temp: 98.2 F (36.8 C)   98.7 F (37.1 C)  TempSrc: Oral   Oral  Resp: '16  18 16  ' Height:      Weight: 44 kg (97 lb)     SpO2: 97%  99% 98%    Intake/Output Summary (Last 24 hours) at 11/14/14 1537 Last data filed at 11/14/14 1522  Gross per 24 hour  Intake 1582.5 ml  Output      0 ml  Net 1582.5 ml    Exam:   General:  Pt is not in distress, right eye opened completely and swelling down.   Cardiovascular: RRR, appreciate S1, S2  Respiratory: clear to auscultation bilaterally, no wheezing   Abdomen: (+) BS< non tender, non distended abdomen   Extremities: No LE edema, pulses palpable   Neuro: Non focal   Data Reviewed: Basic Metabolic Panel:  Recent Labs Lab 11/10/14 1729 11/11/14 1530 11/12/14 0346  NA 128* 138 133*  K 3.6 3.7 3.6  CL 96* 107 108  CO2 25 23 19*  GLUCOSE 79 77 79  BUN '12 7 7  ' CREATININE 0.94 1.06* 1.02*  CALCIUM 9.2 8.3* 7.5*   Liver Function Tests:  Recent Labs Lab 11/10/14 1729 11/11/14 1530  AST 81* 71*  ALT  27 25  ALKPHOS 61 58  BILITOT 0.5 0.7  PROT 7.6 7.5  ALBUMIN 4.3 4.2    Recent Labs Lab 11/10/14 1729  LIPASE 37   No results for input(s): AMMONIA in the last 168 hours. CBC:  Recent Labs Lab 11/10/14 1729 11/11/14 1530  WBC 3.8* 3.5*  NEUTROABS 1.7 1.8  HGB 10.8* 11.1*  HCT 31.2* 32.9*  MCV 78.8 80.8  PLT 227 250   Cardiac Enzymes: No results for input(s): CKTOTAL, CKMB, CKMBINDEX, TROPONINI in the last 168 hours. BNP: Invalid input(s): POCBNP CBG: No results for input(s): GLUCAP in the last 168 hours.  Recent Results (from the past 240 hour(s))  Culture, blood (x 2)     Status: None (Preliminary result)   Collection Time: 11/11/14 12:00 AM  Result Value Ref Range Status   Specimen Description BLOOD LEFT ANTECUBITAL  Final   Special Requests BOTTLES DRAWN AEROBIC AND ANAEROBIC 5ML  Final   Culture   Final    NO GROWTH 3 DAYS Performed at [facility]    Report Status PENDING  Incomplete  Culture, blood (x 2)     Status: None (Preliminary result)   Collection Time: 11/11/14 12:03 AM  Result Value Ref Range Status   Specimen Description BLOOD RIGHT ANTECUBITAL  Final   Special Requests BOTTLES DRAWN AEROBIC ONLY 2ML  Final   Culture   Final    NO GROWTH 3 DAYS Performed at [facility]    Report Status PENDING  Incomplete  MRSA PCR Screening     Status: None   Collection Time: 11/11/14  3:42 AM  Result Value Ref Range Status   MRSA by PCR NEGATIVE NEGATIVE Final    Comment:        The GeneXpert MRSA Assay (FDA approved for NASAL specimens only), is one component of a comprehensive MRSA colonization surveillance program. It is not intended to diagnose MRSA infection nor to guide or monitor treatment for MRSA infections.      Scheduled Meds: . sodium chloride   Intravenous Once  . doxycycline (VIBRAMYCIN) IV  100 mg Intravenous Q12H  . feeding supplement (ENSURE ENLIVE)  237 mL Oral TID BM  . heparin  5,000 Units Subcutaneous 3  times per day  . pantoprazole (PROTONIX) IV  40 mg Intravenous Q12H  . sodium chloride  3 mL Intravenous Q12H   Continuous Infusions: . sodium chloride 75 mL/hr (11/14/14 0828)

## 2014-11-15 DIAGNOSIS — D72819 Decreased white blood cell count, unspecified: Secondary | ICD-10-CM

## 2014-11-15 DIAGNOSIS — A419 Sepsis, unspecified organism: Principal | ICD-10-CM

## 2014-11-15 DIAGNOSIS — E871 Hypo-osmolality and hyponatremia: Secondary | ICD-10-CM

## 2014-11-15 DIAGNOSIS — H00033 Abscess of eyelid right eye, unspecified eyelid: Secondary | ICD-10-CM

## 2014-11-15 DIAGNOSIS — H578 Other specified disorders of eye and adnexa: Secondary | ICD-10-CM

## 2014-11-15 LAB — BASIC METABOLIC PANEL
Anion gap: 8 (ref 5–15)
BUN: 12 mg/dL (ref 6–20)
CALCIUM: 8.9 mg/dL (ref 8.9–10.3)
CHLORIDE: 108 mmol/L (ref 101–111)
CO2: 22 mmol/L (ref 22–32)
CREATININE: 1.12 mg/dL — AB (ref 0.44–1.00)
GFR calc Af Amer: >60 mL/min (ref >60)
GFR calc non Af Amer: >60 mL/min (ref >60)
Glucose, Bld: 77 mg/dL (ref 65–99)
Potassium: 3.9 mmol/L (ref 3.5–5.1)
SODIUM: 138 mmol/L (ref 135–145)

## 2014-11-15 LAB — HIV ANTIBODY (ROUTINE TESTING W REFLEX): HIV Screen 4th Generation wRfx: NONREACTIVE

## 2014-11-15 MED ORDER — DOXYCYCLINE HYCLATE 100 MG PO CAPS: 100.0000 mg | ORAL_CAPSULE | Freq: Two times a day (BID) | ORAL | Status: AC

## 2014-11-15 MED ORDER — ENOXAPARIN SODIUM 40 MG/0.4ML ~~LOC~~ SOLN: 40.0000 mg | SUBCUTANEOUS | Status: DC

## 2014-11-15 MED ORDER — ONDANSETRON HCL 4 MG PO TABS: 4.0000 mg | ORAL_TABLET | Freq: Three times a day (TID) | ORAL | Status: AC | PRN

## 2014-11-15 MED ORDER — FAMOTIDINE 20 MG PO TABS: 20.0000 mg | ORAL_TABLET | Freq: Two times a day (BID) | ORAL | Status: AC

## 2014-11-15 NOTE — Progress Notes (Signed)
Spoke with pt's husband at bedside concerning PCP. Pt has a PCP Dr. Fleet Contras [phone] with a scheduled appointment in 2 weeks.

## 2014-11-15 NOTE — Discharge Summary (Addendum)
Physician Discharge Summary  Jody Powell MWU:[id number] DOB: 06-Feb-1976 DOA: 11/10/2014  PCP: [facility]  Admit date: 11/10/2014 Discharge date: 11/15/2014   Recommendations for Outpatient Follow-Up:   1. The patient was arranged to have follow-up at the [facility] with appointment time as noted below. 2. PCP: Please follow-up on final blood culture results.   Discharge Diagnosis:   Principal Problem:    Sepsis Active Problems:    Preseptal cellulitis of right eye    Hyponatremia    Eye swelling    Leukopenia    Severe protein calorie malnutrition   Discharge disposition:  Home.    Discharge Condition: Improved.  Diet recommendation:   Regular.   History of Present Illness:   Jody Powell is an 39 y.o. female the PMH of asthma, gastric ulcers who was admitted 11/10/14 with preseptal cellulitis and sepsis.   Hospital Course by Problem:   Principal Problem:  Sepsis secondary to preseptal cellulitis / Leukopenia  - Sepsis criteria met on admission with hypotension, mild tachycardia, low grade fever and leukopenia. - Source of infection: right eye cellulitis on clinical exam. Lactic acid and procalcitonin WNL. - Antibiotics changed from vancomycin/Zosyn to doxycycline on 11/14/14.  - Blood cultures so far show no growth. HIV negative.  Active Problems:   Severe protein calorie malnutrition - Evaluated by dietician 11/13/14.  Ensure provided.    Mild leukopenia - HIV serology sent.   Nausea and vomiting - Thought to be secondary to antibiotics. - Given Zofran every 4-6 hours PRN nausea or vomiting.    Hyponatremia - Likely prerenal due to dehydration. - Sodium improved with IV fluids.  - TSH WNL.   Acute renal failure - Likely prerenal due to dehydration. - Improved with hydration.   Normocytic anemia - Hemoglobin stable. Likely secondary to menstrual losses.    Medical Consultants:    None.   Discharge Exam:   Filed  Vitals:   11/15/14 0600  BP: 90/47  Pulse:   Temp:   Resp:    Filed Vitals:   11/14/14 2042 11/15/14 0500 11/15/14 0504 11/15/14 0600  BP: 94/63  88/61 90/47  Pulse: 67  70   Temp: 98.3 F (36.8 C)  98.1 F (36.7 C)   TempSrc: Oral  Oral   Resp: 16  16   Height:      Weight:  46.7 kg (102 lb 15.3 oz)    SpO2:   97%     Gen:  NAD HEENT: Resolution of cellulitic appearance to right periorbital area Cardiovascular:  RRR, No M/R/G Respiratory: Lungs CTAB Gastrointestinal: Abdomen soft, NT/ND with normal active bowel sounds. Extremities: No C/E/C   The results of significant diagnostics from this hospitalization (including imaging, microbiology, ancillary and laboratory) are listed below for reference.     Procedures and Diagnostic Studies:   Ct Orbits W/cm  11/10/2014   CLINICAL DATA:  39 year old female with right eye swelling for 2 days. No known trauma.  EXAM: CT ORBITS WITH CONTRAST  TECHNIQUE: Multidetector CT imaging of the orbits was performed following the bolus administration of intravenous contrast.  CONTRAST:  132m OMNIPAQUE IOHEXOL 300 MG/ML  SOLN  COMPARISON:  None.  FINDINGS: Right preseptal facial soft tissue swelling is identified without evidence of abscess. There is no evidence of postseptal or intraconal abnormality. The globes bilaterally retain their spherical shape.  The paranasal sinuses, mastoid air cells and middle/inner ears are clear.  There is no evidence of fracture or bony abnormality.  The visualized  portions of the brain are unremarkable.  IMPRESSION: Right preseptal facial soft tissue swelling without abscess - question cellulitis.  No other significant abnormalities.   Electronically Signed   By: Margarette Canada M.D.   On: 11/10/2014 21:45     Labs:   Basic Metabolic Panel:  Recent Labs Lab 11/10/14 1729 11/11/14 1530 11/12/14 0346 11/15/14 0825  NA 128* 138 133* 138  K 3.6 3.7 3.6 3.9  CL 96* 107 108 108  CO2 25 23 19* 22  GLUCOSE 79 77  79 77  BUN _0 CREATININE 0.94 1.06* 1.02* 1.12*  CALCIUM 9.2 8.3* 7.5* 8.9   GFR Estimated Creatinine Clearance: 45.6 mL/min (by C-G formula based on Cr of 1.12). Liver Function Tests:  Recent Labs Lab 11/10/14 1729 11/11/14 1530  AST 81* 71*  ALT 27 25  ALKPHOS 61 58  BILITOT 0.5 0.7  PROT 7.6 7.5  ALBUMIN 4.3 4.2    Recent Labs Lab 11/10/14 1729  LIPASE 37   Coagulation profile  Recent Labs Lab 11/10/14 2339  INR 1.23    CBC:  Recent Labs Lab 11/10/14 1729 11/11/14 1530  WBC 3.8* 3.5*  NEUTROABS 1.7 1.8  HGB 10.8* 11.1*  HCT 31.2* 32.9*  MCV 78.8 80.8  PLT 227 250   Microbiology Recent Results (from the past 240 hour(s))  Culture, blood (x 2)     Status: None (Preliminary result)   Collection Time: 11/11/14 12:00 AM  Result Value Ref Range Status   Specimen Description BLOOD LEFT ANTECUBITAL  Final   Special Requests BOTTLES DRAWN AEROBIC AND ANAEROBIC 5ML  Final   Culture   Final    NO GROWTH 3 DAYS Performed at [facility]    Report Status PENDING  Incomplete  Culture, blood (x 2)     Status: None (Preliminary result)   Collection Time: 11/11/14 12:03 AM  Result Value Ref Range Status   Specimen Description BLOOD RIGHT ANTECUBITAL  Final   Special Requests BOTTLES DRAWN AEROBIC ONLY 2ML  Final   Culture   Final    NO GROWTH 3 DAYS Performed at [facility]    Report Status PENDING  Incomplete  MRSA PCR Screening     Status: None   Collection Time: 11/11/14  3:42 AM  Result Value Ref Range Status   MRSA by PCR NEGATIVE NEGATIVE Final    Comment:        The GeneXpert MRSA Assay (FDA approved for NASAL specimens only), is one component of a comprehensive MRSA colonization surveillance program. It is not intended to diagnose MRSA infection nor to guide or monitor treatment for MRSA infections.      Discharge Instructions:       Discharge Instructions    Call MD for:  persistant nausea and vomiting     Complete by:  As directed      Call MD for:  severe uncontrolled pain    Complete by:  As directed      Call MD for:  temperature >100.4    Complete by:  As directed      Diet general    Complete by:  As directed      Increase activity slowly    Complete by:  As directed             Medication List    STOP taking these medications        promethazine 25 MG tablet  Commonly known as:  PHENERGAN  traMADol 50 MG tablet  Commonly known as:  ULTRAM      TAKE these medications        DEXILANT 60 MG capsule  Generic drug:  dexlansoprazole  Take 60 mg by mouth daily.     doxycycline 100 MG capsule  Commonly known as:  VIBRAMYCIN  Take 1 capsule (100 mg total) by mouth 2 (two) times daily.     famotidine 20 MG tablet  Commonly known as:  PEPCID  Take 1 tablet (20 mg total) by mouth 2 (two) times daily.     ondansetron 4 MG tablet  Commonly known as:  ZOFRAN  Take 1 tablet (4 mg total) by mouth every 8 (eight) hours as needed for nausea or vomiting.       Follow-up Information    Follow up with [facility]. Go on 12/01/2014.   Specialty:  Internal Medicine   Why:  at 3:15pm   For Post Hospitalization Follow Up   Contact information:   [place] [place] [address] [phone]        Time coordinating discharge: 35 minutes with discharge instructions reviewed using [place] interpreters.  Signed:  RAMA,CHRISTINA  Pager [phone] [facility] 11/15/2014, 1:06 PM

## 2014-11-16 DIAGNOSIS — E43 Unspecified severe protein-calorie malnutrition: Secondary | ICD-10-CM | POA: Diagnosis present

## 2014-11-16 LAB — CULTURE, BLOOD (ROUTINE X 2)
CULTURE: NO GROWTH
CULTURE: NO GROWTH

## 2015-04-22 ENCOUNTER — Emergency Department (HOSPITAL_COMMUNITY)
Admission: EM | Admit: 2015-04-22 | Discharge: 2015-04-22 | Disposition: A | Discharge status: Home / Self Care | Payer: Medicaid Other | Attending: Emergency Medicine | Admitting: Emergency Medicine

## 2015-04-22 ENCOUNTER — Emergency Department (HOSPITAL_COMMUNITY): Payer: Medicaid Other

## 2015-04-22 ENCOUNTER — Encounter (HOSPITAL_COMMUNITY): Payer: Self-pay | Admitting: Emergency Medicine

## 2015-04-22 DIAGNOSIS — B349 Viral infection, unspecified: Secondary | ICD-10-CM | POA: Diagnosis not present

## 2015-04-22 DIAGNOSIS — R079 Chest pain, unspecified: Secondary | ICD-10-CM | POA: Insufficient documentation

## 2015-04-22 DIAGNOSIS — M25442 Effusion, left hand: Secondary | ICD-10-CM | POA: Insufficient documentation

## 2015-04-22 DIAGNOSIS — Z8719 Personal history of other diseases of the digestive system: Secondary | ICD-10-CM | POA: Diagnosis not present

## 2015-04-22 DIAGNOSIS — J45909 Unspecified asthma, uncomplicated: Secondary | ICD-10-CM | POA: Diagnosis not present

## 2015-04-22 DIAGNOSIS — R51 Headache: Secondary | ICD-10-CM | POA: Diagnosis present

## 2015-04-22 DIAGNOSIS — M542 Cervicalgia: Secondary | ICD-10-CM | POA: Insufficient documentation

## 2015-04-22 DIAGNOSIS — Z792 Long term (current) use of antibiotics: Secondary | ICD-10-CM | POA: Insufficient documentation

## 2015-04-22 DIAGNOSIS — M549 Dorsalgia, unspecified: Secondary | ICD-10-CM | POA: Diagnosis not present

## 2015-04-22 DIAGNOSIS — M25441 Effusion, right hand: Secondary | ICD-10-CM | POA: Diagnosis not present

## 2015-04-22 LAB — BASIC METABOLIC PANEL
ANION GAP: 13 (ref 5–15)
BUN: 14 mg/dL (ref 6–20)
CALCIUM: 9.9 mg/dL (ref 8.9–10.3)
CO2: 23 mmol/L (ref 22–32)
Chloride: 97 mmol/L — ABNORMAL LOW (ref 101–111)
Creatinine, Ser: 0.8 mg/dL (ref 0.44–1.00)
GFR calc Af Amer: >60 mL/min (ref >60)
GLUCOSE: 94 mg/dL (ref 65–99)
POTASSIUM: 3.7 mmol/L (ref 3.5–5.1)
SODIUM: 133 mmol/L — AB (ref 135–145)

## 2015-04-22 LAB — URINE MICROSCOPIC-ADD ON: BACTERIA UA: NONE SEEN

## 2015-04-22 LAB — URINALYSIS, ROUTINE W REFLEX MICROSCOPIC
Bilirubin Urine: NEGATIVE
Glucose, UA: NEGATIVE mg/dL
HGB URINE DIPSTICK: NEGATIVE
Ketones, ur: NEGATIVE mg/dL
Nitrite: NEGATIVE
PROTEIN: NEGATIVE mg/dL
SPECIFIC GRAVITY, URINE: 1.014 (ref 1.005–1.030)
pH: 5.5 (ref 5.0–8.0)

## 2015-04-22 LAB — I-STAT TROPONIN, ED
TROPONIN I, POC: 0 ng/mL (ref 0.00–0.08)
TROPONIN I, POC: 0 ng/mL (ref 0.00–0.08)

## 2015-04-22 LAB — CBC
HCT: 32.1 % — ABNORMAL LOW (ref 36.0–46.0)
Hemoglobin: 11.2 g/dL — ABNORMAL LOW (ref 12.0–15.0)
MCH: 27.9 pg (ref 26.0–34.0)
MCHC: 34.9 g/dL (ref 30.0–36.0)
MCV: 79.9 fL (ref 78.0–100.0)
PLATELETS: 201 10*3/uL (ref 150–400)
RBC: 4.02 MIL/uL (ref 3.87–5.11)
RDW: 13 % (ref 11.5–15.5)
WBC: 3.9 10*3/uL — AB (ref 4.0–10.5)

## 2015-04-22 LAB — PREGNANCY, URINE: PREG TEST UR: NEGATIVE

## 2015-04-22 MED ORDER — SODIUM CHLORIDE 0.9 % IV BOLUS (SEPSIS)
1000.0000 mL | Freq: Once | INTRAVENOUS | Status: AC
  Administered 2015-04-22: 1000 mL via INTRAVENOUS

## 2015-04-22 MED ORDER — HYDROCODONE-ACETAMINOPHEN 5-325 MG PO TABS: 1.0000 | ORAL_TABLET | Freq: Four times a day (QID) | ORAL | Status: AC | PRN

## 2015-04-22 MED ORDER — FENTANYL CITRATE (PF) 100 MCG/2ML IJ SOLN
50.0000 ug | Freq: Once | INTRAMUSCULAR | Status: AC
  Administered 2015-04-22: 50 ug via INTRAVENOUS
  Filled 2015-04-22: qty 2

## 2015-04-22 MED ORDER — SODIUM CHLORIDE 0.9 % IV SOLN: INTRAVENOUS | Status: DC

## 2015-04-22 NOTE — ED Provider Notes (Signed)
CSN: [id number]     Arrival date & time 04/22/15  1218 History   First MD Initiated Contact with Patient 04/22/15 1610     Chief Complaint  Patient presents with  . Chest Pain  . Headache     (Consider location/radiation/quality/duration/timing/severity/associated sxs/prior Treatment) Patient is a 40 y.o. female presenting with chest pain and headaches. The history is provided by the patient and the spouse.  Chest Pain Associated symptoms: back pain, fatigue and headache   Associated symptoms: no abdominal pain, no nausea, no shortness of breath and not vomiting   Headache Associated symptoms: back pain, fatigue, myalgias and neck pain   Associated symptoms: no abdominal pain, no congestion, no nausea, no neck stiffness, no sore throat and no vomiting    [place]. Patient with a complaint of body aches some joint pain mild headache burning sensation in the chest that goes to the back. No nausea no vomiting no upper respiratory symptoms. No abdominal pain. Patient denies any wheezing or cough or any congestion.  Past Medical History  Diagnosis Date  . Asthma   . Multiple gastric ulcers    Past Surgical History  Procedure Laterality Date  . Cholecystectomy     Family History  Problem Relation Age of Onset  . Cancer Neg Hx   . Diabetes Neg Hx   . Heart failure Neg Hx   . Hyperlipidemia Neg Hx    Social History  Substance Use Topics  . Smoking status: Never Smoker   . Smokeless tobacco: Never Used  . Alcohol Use: No   OB History    No data available     Review of Systems  Constitutional: Positive for fatigue.  HENT: Negative for congestion and sore throat.   Eyes: Negative for redness and visual disturbance.  Respiratory: Negative for shortness of breath.   Cardiovascular: Positive for chest pain.  Gastrointestinal: Negative for nausea, vomiting and abdominal pain.  Genitourinary: Negative for dysuria.  Musculoskeletal:  Positive for myalgias, back pain, joint swelling and neck pain. Negative for neck stiffness.  Skin: Negative for rash.  Neurological: Positive for headaches.  Hematological: Does not bruise/bleed easily.  Psychiatric/Behavioral: Negative for confusion.      Allergies  Other  Home Medications   Prior to Admission medications   Medication Sig Start Date End Date Taking? Authorizing Provider  dexlansoprazole (DEXILANT) 60 MG capsule Take 60 mg by mouth daily.    Historical Provider, MD  doxycycline (VIBRAMYCIN) 100 MG capsule Take 1 capsule (100 mg total) by mouth 2 (two) times daily. 11/15/14   Maryruth Bun Rama, MD  famotidine (PEPCID) 20 MG tablet Take 1 tablet (20 mg total) by mouth 2 (two) times daily. 11/15/14   Maryruth Bun Rama, MD  HYDROcodone-acetaminophen (NORCO/VICODIN) 5-325 MG tablet Take 1-2 tablets by mouth every 6 (six) hours as needed for moderate pain. 04/22/15   Vanetta Mulders, MD  ondansetron (ZOFRAN) 4 MG tablet Take 1 tablet (4 mg total) by mouth every 8 (eight) hours as needed for nausea or vomiting. 11/15/14   Christina P Rama, MD   BP 98/68 mmHg  Pulse 69  Temp(Src) 98.1 F (36.7 C) (Oral)  Resp 13  SpO2 98% Physical Exam  Constitutional: She is oriented to person, place, and time. She appears well-developed and well-nourished. No distress.  HENT:  Head: Normocephalic and atraumatic.  Eyes: Conjunctivae and EOM are normal. Pupils are equal, round, and reactive to light.  Neck: Normal range of  motion. Neck supple.  Cardiovascular: Normal rate, regular rhythm and intact distal pulses.   No murmur heard. Pulmonary/Chest: Effort normal and breath sounds normal. No respiratory distress.  Abdominal: Soft. Bowel sounds are normal. There is no tenderness.  Musculoskeletal: Normal range of motion.  Joint swelling mild to both hands. No erythema. No distinct increase in warmth.  Neurological: She is alert and oriented to person, place, and time. No cranial nerve deficit.  She exhibits normal muscle tone. Coordination normal.  Skin: Skin is warm. No rash noted.  Nursing note and vitals reviewed.   ED Course  Procedures (including critical care time) Labs Review Labs Reviewed  BASIC METABOLIC PANEL - Abnormal; Notable for the following:    Sodium 133 (*)    Chloride 97 (*)    All other components within normal limits  CBC - Abnormal; Notable for the following:    WBC 3.9 (*)    Hemoglobin 11.2 (*)    HCT 32.1 (*)    All other components within normal limits  URINALYSIS, ROUTINE W REFLEX MICROSCOPIC (NOT AT [facility]) - Abnormal; Notable for the following:    Leukocytes, UA TRACE (*)    All other components within normal limits  URINE MICROSCOPIC-ADD ON - Abnormal; Notable for the following:    Squamous Epithelial / LPF 0-5 (*)    All other components within normal limits  PREGNANCY, URINE  I-STAT TROPOININ, ED  I-STAT TROPOININ, ED   Results for orders placed or performed during the hospital encounter of 04/22/15  Basic metabolic panel  Result Value Ref Range   Sodium 133 (L) 135 - 145 mmol/L   Potassium 3.7 3.5 - 5.1 mmol/L   Chloride 97 (L) 101 - 111 mmol/L   CO2 23 22 - 32 mmol/L   Glucose, Bld 94 65 - 99 mg/dL   BUN 14 6 - 20 mg/dL   Creatinine, Ser 1.61 0.44 - 1.00 mg/dL   Calcium 9.9 8.9 - 09.6 mg/dL   GFR calc non Af Amer >60 >60 mL/min   GFR calc Af Amer >60 >60 mL/min   Anion gap 13 5 - 15  CBC  Result Value Ref Range   WBC 3.9 (L) 4.0 - 10.5 K/uL   RBC 4.02 3.87 - 5.11 MIL/uL   Hemoglobin 11.2 (L) 12.0 - 15.0 g/dL   HCT 04.5 (L) 40.9 - 81.1 %   MCV 79.9 78.0 - 100.0 fL   MCH 27.9 26.0 - 34.0 pg   MCHC 34.9 30.0 - 36.0 g/dL   RDW 91.4 78.2 - 95.6 %   Platelets 201 150 - 400 K/uL  Urinalysis, Routine w reflex microscopic (not at [facility])  Result Value Ref Range   Color, Urine YELLOW YELLOW   APPearance CLEAR CLEAR   Specific Gravity, Urine 1.014 1.005 - 1.030   pH 5.5 5.0 - 8.0   Glucose, UA NEGATIVE NEGATIVE mg/dL   Hgb urine  dipstick NEGATIVE NEGATIVE   Bilirubin Urine NEGATIVE NEGATIVE   Ketones, ur NEGATIVE NEGATIVE mg/dL   Protein, ur NEGATIVE NEGATIVE mg/dL   Nitrite NEGATIVE NEGATIVE   Leukocytes, UA TRACE (A) NEGATIVE  Pregnancy, urine  Result Value Ref Range   Preg Test, Ur NEGATIVE NEGATIVE  Urine microscopic-add on  Result Value Ref Range   Squamous Epithelial / LPF 0-5 (A) NONE SEEN   WBC, UA 0-5 0 - 5 WBC/hpf   RBC / HPF 0-5 0 - 5 RBC/hpf   Bacteria, UA NONE SEEN NONE SEEN  I-stat troponin, ED (  not at [facility])  Result Value Ref Range   Troponin i, poc 0.00 0.00 - 0.08 ng/mL   Comment 3          I-Stat Troponin, ED (not at [facility])  Result Value Ref Range   Troponin i, poc 0.00 0.00 - 0.08 ng/mL   Comment 3             Imaging Review Dg Chest 2 View  04/22/2015  CLINICAL DATA:  40 year old female with chest pain and body aches. EXAM: CHEST  2 VIEW COMPARISON:  10/06/2014 FINDINGS: The cardiomediastinal silhouette is unremarkable. Mild peribronchial thickening is unchanged. There is no evidence of focal airspace disease, pulmonary edema, suspicious pulmonary nodule/mass, pleural effusion, or pneumothorax. No acute bony abnormalities are identified. IMPRESSION: No active cardiopulmonary disease. Electronically Signed   By: Harmon Pier M.D.   On: 04/22/2015 13:39   I have personally reviewed and evaluated these images and lab results as part of my medical decision-making.   EKG Interpretation   Date/Time:  Sunday April 22 2015 12:29:21 EST Ventricular Rate:  95 PR Interval:  200 QRS Duration: 80 QT Interval:  350 QTC Calculation: 439 R Axis:   98 Text Interpretation:  Normal sinus rhythm Rightward axis Cannot rule out  Anterior infarct , age undetermined Abnormal ECG Confirmed by Meta Kroenke   MD, Minh Roanhorse [phone]) on 04/22/2015 4:27:19 PM      MDM   Final diagnoses:  Viral illness  Chest pain, unspecified chest pain type    Patient with several complaints that seem to include  joint aches bodyaches, no specific headache complaint with me. Also with a burning sensation in the chest going to the back. Patient nontoxic no acute distress. Workup without any acute findings. No evidence of acute cardiac event. Chest x-ray negative for pneumonia pneumothorax and pulmonary edema EKG without any acute findings labs including troponin are negative. Troponin was checked twice. Urinalysis is negative. Will treat for the bodyaches and the joint pain possibly could be a viral illness. Patient stable for discharge home does have follow-up. No tachycardia.  Vanetta Mulders, MD 04/22/15 [phone]

## 2015-04-22 NOTE — ED Notes (Signed)
Pt c/o burning chest, head and back. Pt feels "pain in all joints".

## 2015-04-22 NOTE — Discharge Instructions (Signed)
Workup without any acute findings. No evidence of any acute heart problems. No evidence of pneumonia. Symptoms may be related to a viral illness. Take medication as needed for the joint pain. Make an appointment to follow-up with your regular doctor. Return for any new or worse symptoms.

## 2015-04-22 NOTE — ED Notes (Signed)
Pt place on 2L [place]  

## 2016-03-24 IMAGING — CT CT ABD-PELV W/ CM
2 of 5 series · 15 of 46 positions shown, 17 images · IV contrast (Omni 300)
Comparison: Ultrasound 09/21/2014

CLINICAL DATA: Abdominal pain.  History of gastric ulcers.

EXAM:
CT ABDOMEN AND PELVIS WITH CONTRAST
TECHNIQUE: Multidetector CT imaging of the abdomen and pelvis was performed
using the standard protocol following bolus administration of
intravenous contrast.
CONTRAST:  75mL OMNIPAQUE IOHEXOL 300 MG/ML  SOLN

[Series 3: abd/ pelvis 5.0 i30f 1 · axial · 0.66mm/px · z∈[+686,+1091]mm · 12 of 91 slices shown, 14 images]
[im 5/91  soft-tissue]
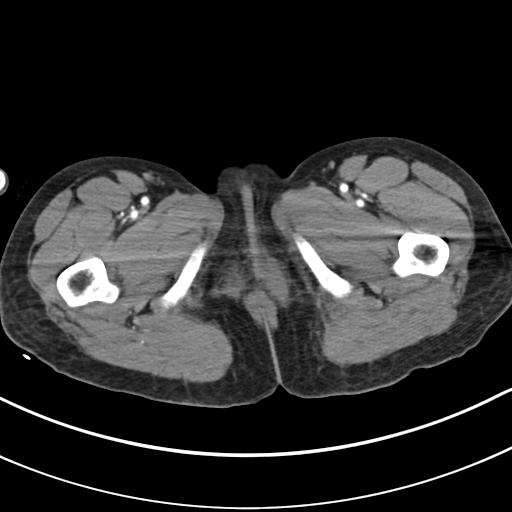
[im 5/91  bone]
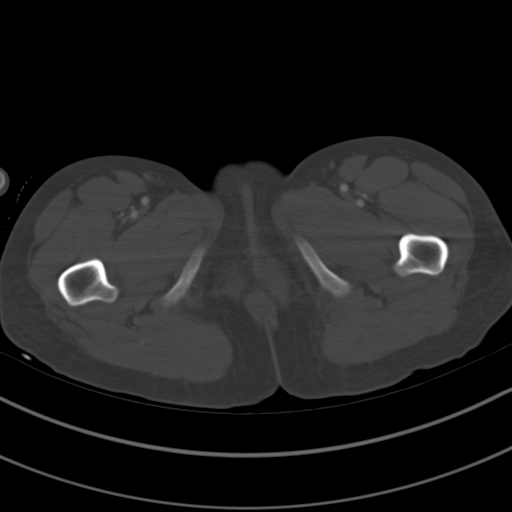
[im 14/91  soft-tissue]
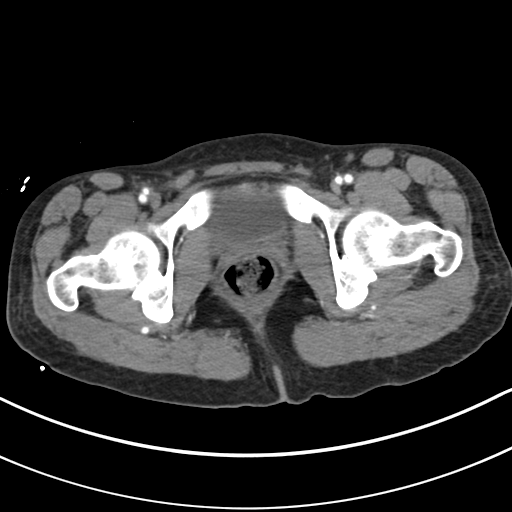
[im 19/91  soft-tissue]
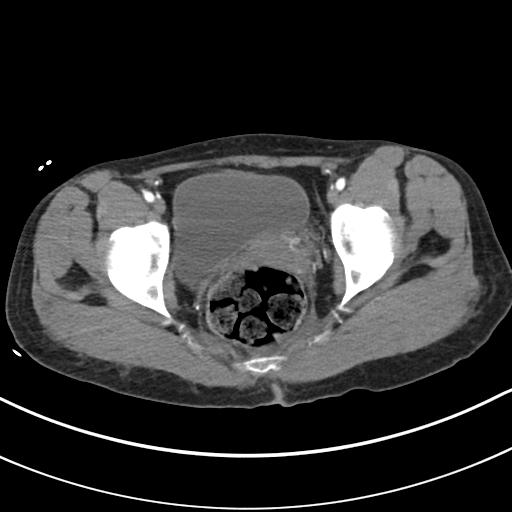
[im 28/91  soft-tissue]
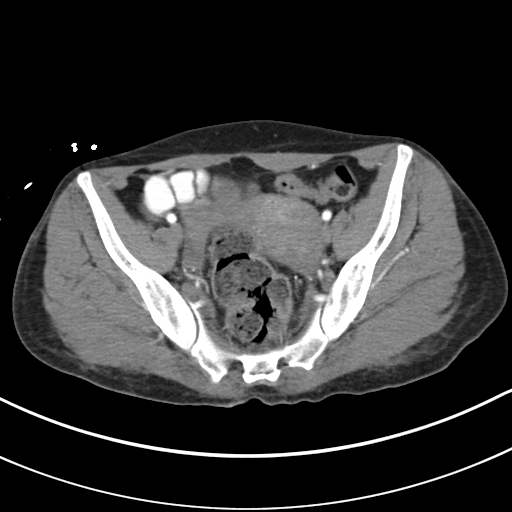
[im 37/91  soft-tissue]
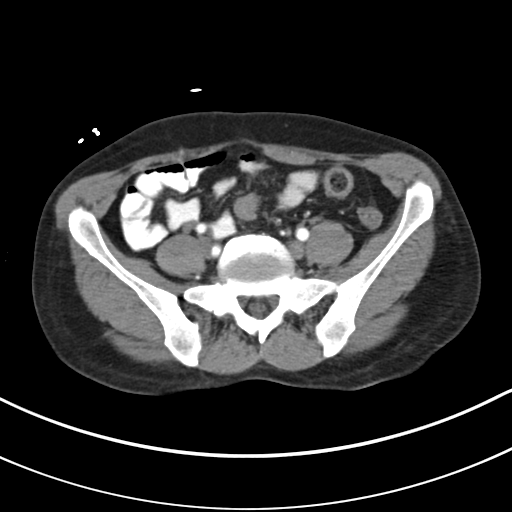
[im 41/91  soft-tissue]
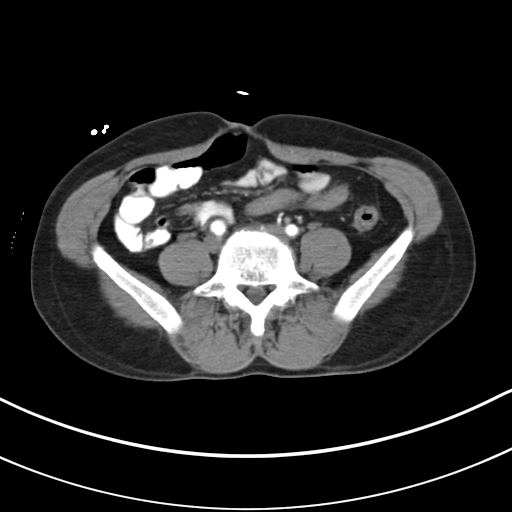
[im 50/91  soft-tissue]
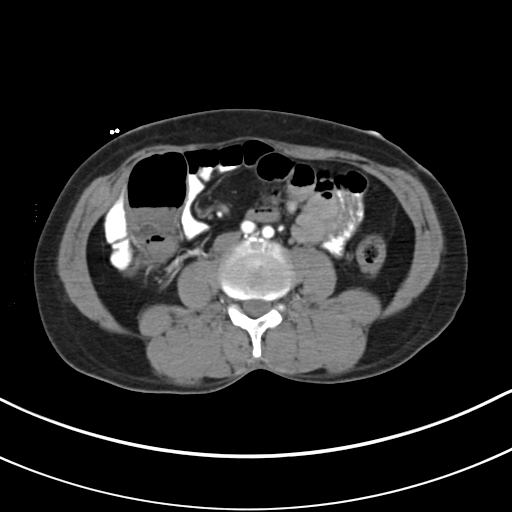
[im 55/91  soft-tissue]
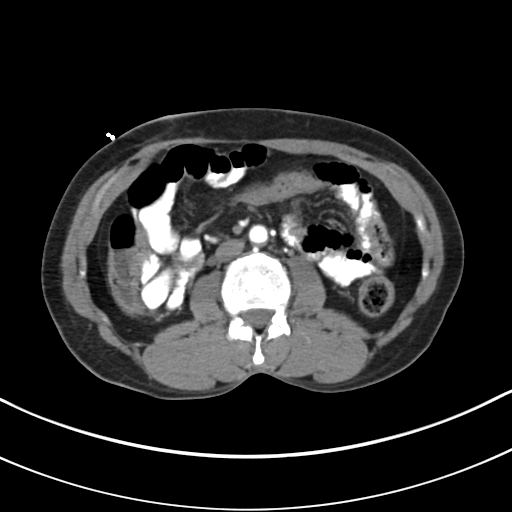
[im 64/91  soft-tissue]
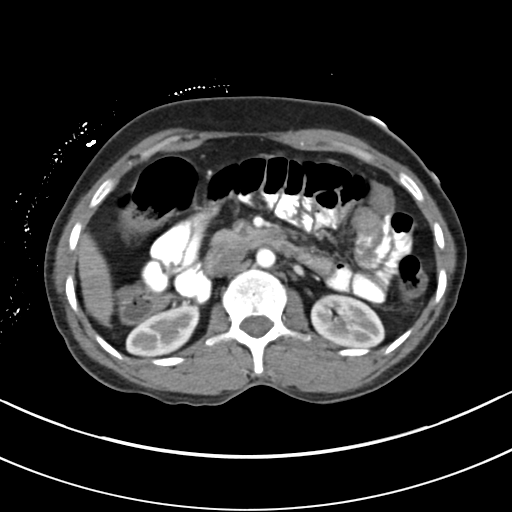
[im 64/91  bone]
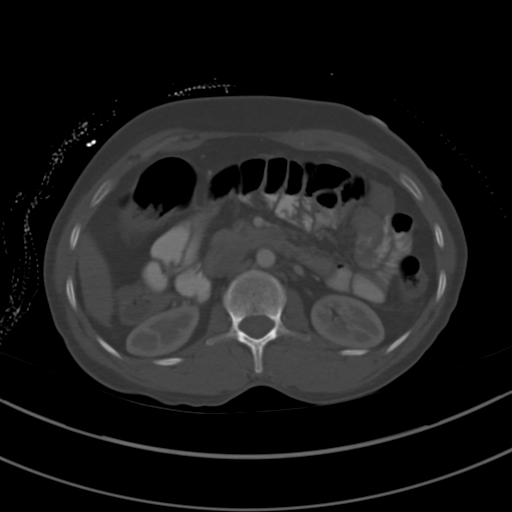
[im 73/91  soft-tissue]
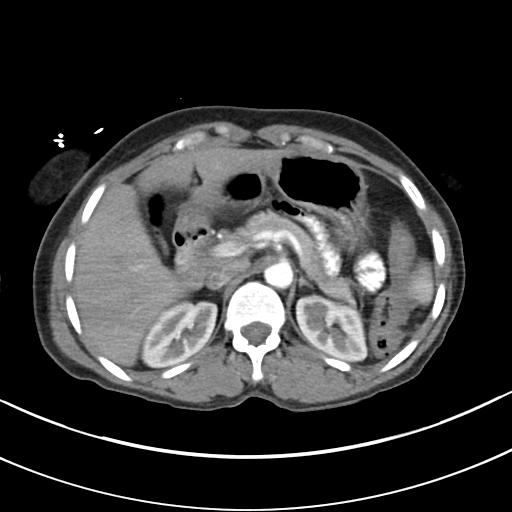
[im 77/91  soft-tissue]
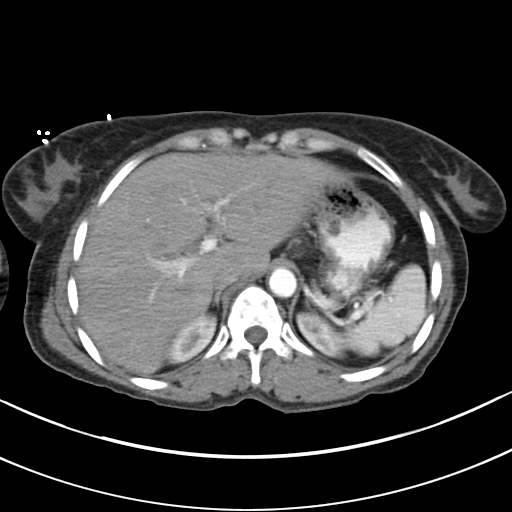
[im 86/91  soft-tissue]
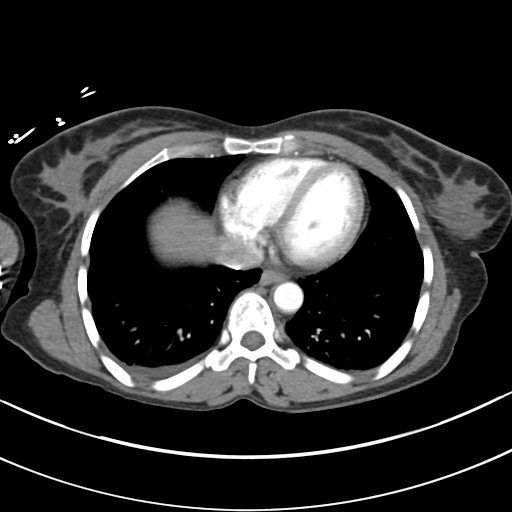

[Series 6: coronals · coronal · 0.60mm/px · 3 of 96 slices shown]
[im 32/96  soft-tissue]
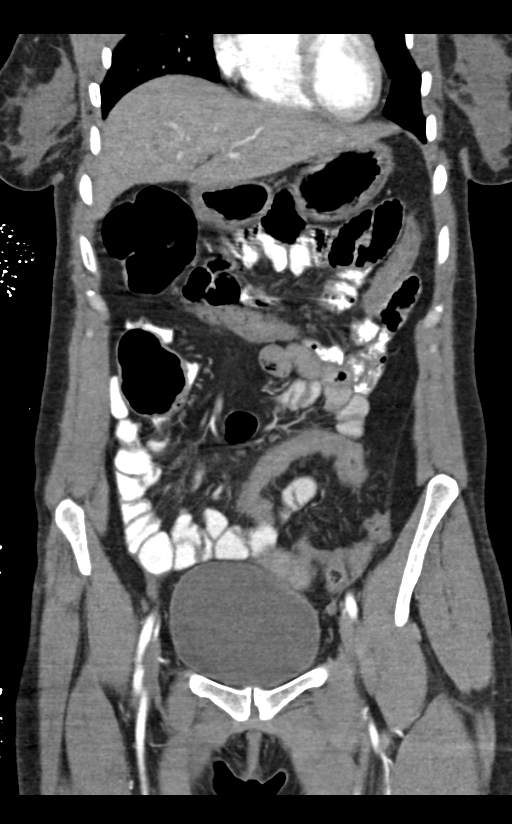
[im 43/96  soft-tissue]
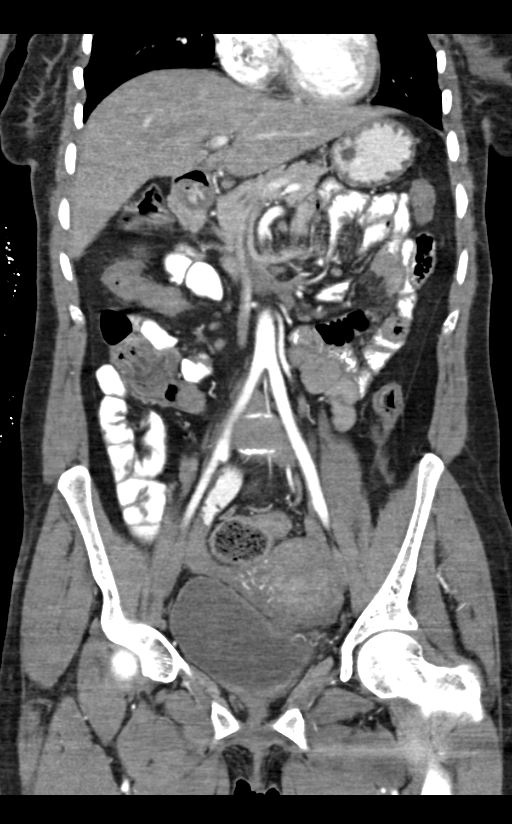
[im 53/96  soft-tissue]
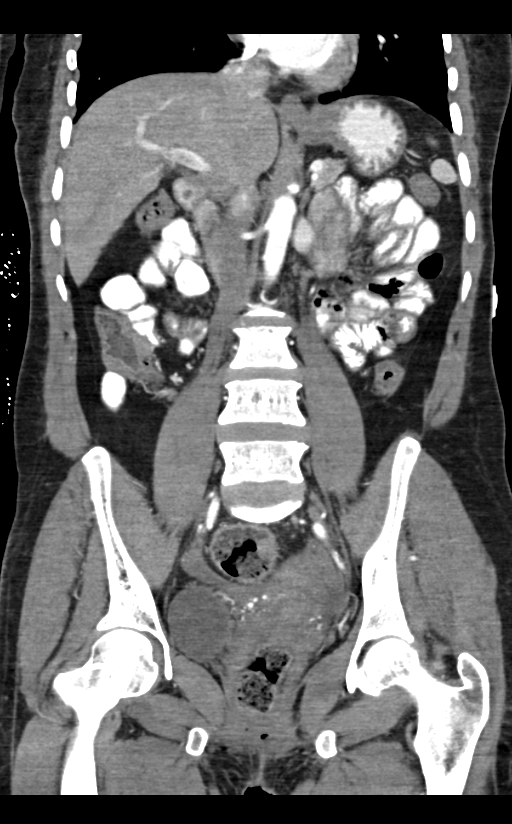

[15 of 46 positions shown; findings below may reference images not displayed]

FINDINGS: Lower chest: There are mild ground-glass opacities in the posterior
lower lobes bilaterally, infectious versus atelectatic. There is a
trace right pleural effusion.

Hepatobiliary: There is prior cholecystectomy. The liver and bile
ducts appear unremarkable.

Pancreas: Normal

Spleen: Normal

Adrenals/Urinary Tract: The adrenals and kidneys are normal in
appearance. There is no urinary calculus evident. There is no
hydronephrosis or ureteral dilatation. Collecting systems and
ureters appear unremarkable.

Stomach/Bowel: There is mild mural thickening of the entire colon,
raising the question of colitis. No focal abnormality is evident.
There is no bowel obstruction. There is no extraluminal air. Small
bowel is unremarkable. Stomach is unremarkable.

Vascular/Lymphatic: The abdominal aorta is normal in caliber. There
is no atherosclerotic calcification. There is no adenopathy in the
abdomen or pelvis.

Reproductive: The uterus and ovaries appear unremarkable.

Other: No focal inflammatory changes are evident in the abdomen or
pelvis. There is no ascites.

Musculoskeletal: No significant abnormality.
IMPRESSION: 1. Mild ground-glass opacities in the posterior lung bases. Trace
right pleural effusion. Cannot exclude infectious infiltrates.
2. Mild diffuse mural thickening of the entire colon. Query colitis.
3. Negative for bowel obstruction, perforation, focal inflammatory
change, ascites or adenopathy.
